# Patient Record
Sex: Male | Born: 1980
Health system: Southern US, Community
[De-identification: ages and names within clinical notes are randomized; demographics above are authoritative.]

---

## 2016-09-05 ENCOUNTER — Ambulatory Visit (INDEPENDENT_AMBULATORY_CARE_PROVIDER_SITE_OTHER): Payer: BLUE CROSS/BLUE SHIELD | Admitting: Family Medicine

## 2016-09-05 VITALS — BP 132/70 | HR 69 | Temp 97.9°F | Resp 16 | Ht 70.75 in | Wt 201.6 lb

## 2016-09-05 DIAGNOSIS — Z0000 Encounter for general adult medical examination without abnormal findings: Secondary | ICD-10-CM

## 2016-09-05 DIAGNOSIS — N5082 Scrotal pain: Secondary | ICD-10-CM

## 2016-09-05 LAB — POCT URINALYSIS DIP (MANUAL ENTRY)
Bilirubin, UA: NEGATIVE
Blood, UA: NEGATIVE
Glucose, UA: NEGATIVE
Ketones, POC UA: NEGATIVE
LEUKOCYTES UA: NEGATIVE
NITRITE UA: NEGATIVE
PH UA: 7
PROTEIN UA: NEGATIVE
Spec Grav, UA: 1.015
Urobilinogen, UA: 0.2

## 2016-09-05 NOTE — Progress Notes (Signed)
Chief Complaint  Patient presents with  . Annual Exam  . Testicle Pain    x 4 - 5 days    Subjective:  Travis Wiggins is a 35 y.o. male here for a health maintenance visit.  Patient is new pt  Testicular Pain Pt reports that Thursday night, 2 days ago, he woke up and had throbbing pain.  Reports that Thursday he played tennis then ran 4.5 miles at work.  He states that he did not have any pain at the time of the strenuous exercise.  He reports that he could narrow it down to the left testicle. He denies history of hernias. He denies any bulges in the groin.  He reports that his symptoms improved daily.  His pain is much better but noticed it when he was in a bouncy house and was jumping.   There are no active problems to display for this patient.   No past medical history on file.  No past surgical history on file.   No outpatient prescriptions prior to visit.   No facility-administered medications prior to visit.     Allergies  Allergen Reactions  . Penicillins      No family history on file.   Health Habits: Dental Exam: up to date Eye Exam: up to date Exercise: 5 times/week on average Current exercise activities: running, lifting and tennis Diet: balanced  Social History   Social History  . Marital status: Married    Spouse name: N/A  . Number of children: N/A  . Years of education: N/A   Occupational History  . Not on file.   Social History Main Topics  . Smoking status: Never Smoker  . Smokeless tobacco: Not on file  . Alcohol use Not on file  . Drug use: Unknown  . Sexual activity: Not on file   Other Topics Concern  . Not on file   Social History Narrative  . No narrative on file   History  Alcohol use Not on file   History  Smoking Status  . Never Smoker  Smokeless Tobacco  . Not on file   History  Drug use: Unknown      Health Maintenance: See under health Maintenance activity for review of completion dates as well.  There  is no immunization history on file for this patient.    Depression Screen-PHQ2/9 Depression screen [facility] 2/9 09/05/2016  Decreased Interest 0  Down, Depressed, Hopeless 0  PHQ - 2 Score 0      Depression Severity and Treatment Recommendations:  0-4= None  5-9= Mild / Treatment: Support, educate to call if worse; return in one month  10-14= Moderate / Treatment: Support, watchful waiting; Antidepressant or Psycotherapy  15-19= Moderately severe / Treatment: Antidepressant OR Psychotherapy  >= 20 = Major depression, severe / Antidepressant AND Psychotherapy    Review of Systems   Review of Systems  Constitutional: Negative for chills, fever and weight loss.  HENT: Negative for hearing loss and tinnitus.   Eyes: Negative for blurred vision, double vision and photophobia.  Respiratory: Negative for cough, shortness of breath and wheezing.   Cardiovascular: Negative for chest pain, palpitations and orthopnea.  Gastrointestinal: Negative for abdominal pain, blood in stool, constipation, nausea and vomiting.  Genitourinary: Negative for dysuria, flank pain, frequency, hematuria and urgency.  Musculoskeletal: Negative for myalgias and neck pain.  Skin: Negative for itching and rash.  Neurological: Negative for dizziness, tingling and headaches.  Endo/Heme/Allergies: Negative for environmental allergies and polydipsia. Does not  bruise/bleed easily.  Psychiatric/Behavioral: Negative for depression. The patient is not nervous/anxious.     See HPI for ROS as well.    Objective:   Vitals:   09/05/16 1405  BP: 132/70  Pulse: 69  Resp: 16  Temp: 97.9 F (36.6 C)  TempSrc: Oral  SpO2: 99%  Weight: 201 lb 9.6 oz (91.4 kg)  Height: 5' 10.75" (1.797 m)    Body mass index is 28.32 kg/m.  Physical Exam  Constitutional: He is oriented to person, place, and time. He appears well-developed and well-nourished.  HENT:  Head: Normocephalic and atraumatic.  Right Ear: External ear  normal.  Left Ear: External ear normal.  Nose: Nose normal.  Mouth/Throat: Oropharynx is clear and moist.  Eyes: Conjunctivae and EOM are normal. Pupils are equal, round, and reactive to light. Right eye exhibits no discharge. Left eye exhibits no discharge.  Neck: Normal range of motion. Neck supple.  Cardiovascular: Normal rate, regular rhythm and normal heart sounds.   No murmur heard. Pulmonary/Chest: Effort normal and breath sounds normal. No respiratory distress. He has no wheezes. He has no rales.  Abdominal: Soft. Bowel sounds are normal. He exhibits no distension. There is no tenderness. There is no rebound. Hernia confirmed negative in the right inguinal area and confirmed negative in the left inguinal area.  Genitourinary: Penis normal. Cremasteric reflex is present. Right testis shows no mass, no swelling and no tenderness. Right testis is descended. Cremasteric reflex is not absent on the right side. Left testis shows no mass, no swelling and no tenderness. Left testis is descended. Cremasteric reflex is not absent on the left side. Circumcised. No penile erythema. No discharge found.  Musculoskeletal: Normal range of motion. He exhibits no edema.  Lymphadenopathy:       Right: No inguinal adenopathy present.       Left: No inguinal adenopathy present.  Neurological: He is alert and oriented to person, place, and time. He has normal reflexes.  Skin: Skin is warm. No erythema.  Psychiatric: He has a normal mood and affect. His behavior is normal. Judgment and thought content normal.       Assessment/Plan:   Patient was seen for a health maintenance exam.  Counseled the patient on health maintenance issues. Reviewed her health mainteance schedule and ordered appropriate tests (see orders.) Counseled on regular exercise and weight management. Recommend regular eye exams and dental cleaning.   The following issues were addressed today for health maintenance:   Travis Wiggins was seen  today for annual exam and testicle pain.  Diagnoses and all orders for this visit:  Health maintenance examination- reviewed age appropriate screenings Since labs were done for biometrics will not repeat today  Acute pain in scrotum- advised pt to follow up for scrotal US Will check UA for infection -     US Scrotum; Future -     POCT urinalysis dipstick    No Follow-up on file.    Body mass index is 28.32 kg/m.:  Discussed the patient's BMI with patient. The BMI body mass index is 28.32 kg/m.     No future appointments.  Patient Instructions       IF you received an x-ray today, you will receive an invoice from [facility]. Please contact [facility] at [phone] with questions or concerns regarding your invoice.   IF you received labwork today, you will receive an invoice from [facility]. Please contact [facility] at [phone] with questions or concerns regarding your invoice.   Our  billing staff will not be able to assist you with questions regarding bills from these companies.  You will be contacted with the lab results as soon as they are available. The fastest way to get your results is to activate your My Chart account. Instructions are located on the last page of this paperwork. If you have not heard from [place] regarding the results in 2 weeks, please contact this office.    Health Maintenance, Male A healthy lifestyle and preventative care can promote health and wellness.  Maintain regular health, dental, and eye exams.  Eat a healthy diet. Foods like vegetables, fruits, whole grains, low-fat dairy products, and lean protein foods contain the nutrients you need and are low in calories. Decrease your intake of foods high in solid fats, added sugars, and salt. Get information about a proper diet from your health care provider, if necessary.  Regular physical exercise is one of the most important things you can do for your health. Most adults  should get at least 150 minutes of moderate-intensity exercise (any activity that increases your heart rate and causes you to sweat) each week. In addition, most adults need muscle-strengthening exercises on 2 or more days a week.   Maintain a healthy weight. The body mass index (BMI) is a screening tool to identify possible weight problems. It provides an estimate of body fat based on height and weight. Your health care provider can find your BMI and can help you achieve or maintain a healthy weight. For males 20 years and older:  A BMI below 18.5 is considered underweight.  A BMI of 18.5 to 24.9 is normal.  A BMI of 25 to 29.9 is considered overweight.  A BMI of 30 and above is considered obese.  Maintain normal blood lipids and cholesterol by exercising and minimizing your intake of saturated fat. Eat a balanced diet with plenty of fruits and vegetables. Blood tests for lipids and cholesterol should begin at age 44 and be repeated every 5 years. If your lipid or cholesterol levels are high, you are over age 83, or you are at high risk for heart disease, you may need your cholesterol levels checked more frequently.Ongoing high lipid and cholesterol levels should be treated with medicines if diet and exercise are not working.  If you smoke, find out from your health care provider how to quit. If you do not use tobacco, do not start.  Lung cancer screening is recommended for adults aged 55-80 years who are at high risk for developing lung cancer because of a history of smoking. A yearly low-dose CT scan of the lungs is recommended for people who have at least a 30-pack-year history of smoking and are current smokers or have quit within the past 15 years. A pack year of smoking is smoking an average of 1 pack of cigarettes a day for 1 year (for example, a 30-pack-year history of smoking could mean smoking 1 pack a day for 30 years or 2 packs a day for 15 years). Yearly screening should continue  until the smoker has stopped smoking for at least 15 years. Yearly screening should be stopped for people who develop a health problem that would prevent them from having lung cancer treatment.  If you choose to drink alcohol, do not have more than 2 drinks per day. One drink is considered to be 12 oz (360 mL) of beer, 5 oz (150 mL) of wine, or 1.5 oz (45 mL) of liquor.  Avoid the use  of street drugs. Do not share needles with anyone. Ask for help if you need support or instructions about stopping the use of drugs.  High blood pressure causes heart disease and increases the risk of stroke. High blood pressure is more likely to develop in:  People who have blood pressure in the end of the normal range (100-139/85-89 mm Hg).  People who are overweight or obese.  People who are African American.  If you are 5318-35 years of age, have your blood pressure checked every 3-5 years. If you are 35 years of age or older, have your blood pressure checked every year. You should have your blood pressure measured twice-once when you are at a hospital or clinic, and once when you are not at a hospital or clinic. Record the average of the two measurements. To check your blood pressure when you are not at a hospital or clinic, you can use:  An automated blood pressure machine at a pharmacy.  A home blood pressure monitor.  If you are 1545-170 years old, ask your health care provider if you should take aspirin to prevent heart disease.  Diabetes screening involves taking a blood sample to check your fasting blood sugar level. This should be done once every 3 years after age 35 if you are at a normal weight and without risk factors for diabetes. Testing should be considered at a younger age or be carried out more frequently if you are overweight and have at least 1 risk factor for diabetes.  Colorectal cancer can be detected and often prevented. Most routine colorectal cancer screening begins at the age of 35 and  continues through age 35. However, your health care provider may recommend screening at an earlier age if you have risk factors for colon cancer. On a yearly basis, your health care provider may provide home test kits to check for hidden blood in the stool. A small camera at the end of a tube may be used to directly examine the colon (sigmoidoscopy or colonoscopy) to detect the earliest forms of colorectal cancer. Talk to your health care provider about this at age 35 when routine screening begins. A direct exam of the colon should be repeated every 5-10 years through age 35, unless early forms of precancerous polyps or small growths are found.  People who are at an increased risk for hepatitis B should be screened for this virus. You are considered at high risk for hepatitis B if:  You were born in a country where hepatitis B occurs often. Talk with your health care provider about which countries are considered high risk.  Your parents were born in a high-risk country and you have not received a shot to protect against hepatitis B (hepatitis B vaccine).  You have HIV or AIDS.  You use needles to inject street drugs.  You live with, or have sex with, someone who has hepatitis B.  You are a man who has sex with other men (MSM).  You get hemodialysis treatment.  You take certain medicines for conditions like cancer, organ transplantation, and autoimmune conditions.  Hepatitis C blood testing is recommended for all people born from [id number] through 1965 and any individual with known risk factors for hepatitis C.  Healthy men should no longer receive prostate-specific antigen (PSA) blood tests as part of routine cancer screening. Talk to your health care provider about prostate cancer screening.  Testicular cancer screening is not recommended for adolescents or adult males who have no symptoms. Screening  includes self-exam, a health care provider exam, and other screening tests. Consult with your  health care provider about any symptoms you have or any concerns you have about testicular cancer.  Practice safe sex. Use condoms and avoid high-risk sexual practices to reduce the spread of sexually transmitted infections (STIs).  You should be screened for STIs, including gonorrhea and chlamydia if:  You are sexually active and are younger than 24 years.  You are older than 24 years, and your health care provider tells you that you are at risk for this type of infection.  Your sexual activity has changed since you were last screened, and you are at an increased risk for chlamydia or gonorrhea. Ask your health care provider if you are at risk.  If you are at risk of being infected with HIV, it is recommended that you take a prescription medicine daily to prevent HIV infection. This is called pre-exposure prophylaxis (PrEP). You are considered at risk if:  You are a man who has sex with other men (MSM).  You are a heterosexual man who is sexually active with multiple partners.  You take drugs by injection.  You are sexually active with a partner who has HIV.  Talk with your health care provider about whether you are at high risk of being infected with HIV. If you choose to begin PrEP, you should first be tested for HIV. You should then be tested every 3 months for as long as you are taking PrEP.  Use sunscreen. Apply sunscreen liberally and repeatedly throughout the day. You should seek shade when your shadow is shorter than you. Protect yourself by wearing long sleeves, pants, a wide-brimmed hat, and sunglasses year round whenever you are outdoors.  Tell your health care provider of new moles or changes in moles, especially if there is a change in shape or color. Also, tell your health care provider if a mole is larger than the size of a pencil eraser.  A one-time screening for abdominal aortic aneurysm (AAA) and surgical repair of large AAAs by ultrasound is recommended for men aged  65-75 years who are current or former smokers.  Stay current with your vaccines (immunizations). This information is not intended to replace advice given to you by your health care provider. Make sure you discuss any questions you have with your health care provider. Document Released: 03/05/2008 Document Revised: 09/28/2014 Document Reviewed: 06/11/2015 Elsevier Interactive Patient Education  2017 [employer]  Scrotal Swelling Scrotal swelling may occur on one or both sides of the scrotum. Pain may also occur with swelling. Possible causes of scrotal swelling include:  Injury.  Infection.  An ingrown hair or abrasion in the area.  Repeated rubbing from tight-fitting underwear.  Poor hygiene.  A weakened area in the muscles around the groin (hernia). A hernia can allow abdominal contents to push into the scrotum.  Fluid around the testicle (hydrocele).  Enlarged vein around the testicle (varicocele).  Certain medical treatments or existing conditions.  A recent genital surgery or procedure.  The spermatic cord becomes twisted in the scrotum, which cuts off blood supply (testicular torsion).  Testicular cancer. Follow these instructions at home: Once the cause of your scrotal swelling has been determined, you may be asked to monitor your scrotum for any changes. The following actions may help to alleviate any discomfort you are experiencing:  Rest and limit activity until the swelling goes away. Lying down is the preferred position.  Put ice on the scrotum:  Put ice in a plastic bag.  Place a towel between your skin and the bag.  Leave the ice on for 20 minutes, 2-3 times a day for 1-2 days.  Place a rolled towel under the testicles for support.  Wear loose-fitting clothing or an athletic support cup for comfort.  Take all medicines as directed by your health care provider.  Perform a monthly self-exam of the scrotum and penis. Feel for changes. Ask your health  care provider how to perform a monthly self-exam if you are unsure. Contact a health care provider if:  You have a sudden (acute) onset of pain that is persistent and not improving.  You notice a heavy feeling or fluid in the scrotum.  You have pain or burning while urinating.  You have blood in the urine or semen.  You feel a lump around the testicle.  You notice that one testicle is larger than the other (slight variation is normal).  You have a persistent dull ache or pain in the groin or scrotum. Get help right away if:  The pain does not go away or becomes severe.  You have a fever or shaking chills.  You have pain or vomiting that cannot be controlled.  You notice significant redness or swelling of one or both sides of the scrotum.  You experience redness spreading upward from your scrotum to your abdomen or downward from your scrotum to your thighs. This information is not intended to replace advice given to you by your health care provider. Make sure you discuss any questions you have with your health care provider. Document Released: 10/10/2010 Document Revised: 03/27/2016 Document Reviewed: 02/09/2013 Elsevier Interactive Patient Education  2017 [employer].

## 2016-09-05 NOTE — Patient Instructions (Addendum)
IF you received an x-ray today, you will receive an invoice from [facility]. Please contact [facility] at [phone] with questions or concerns regarding your invoice.   IF you received labwork today, you will receive an invoice from [facility]. Please contact [facility] at [phone] with questions or concerns regarding your invoice.   Our billing staff will not be able to assist you with questions regarding bills from these companies.  You will be contacted with the lab results as soon as they are available. The fastest way to get your results is to activate your My Chart account. Instructions are located on the last page of this paperwork. If you have not heard from [place] regarding the results in 2 weeks, please contact this office.    Health Maintenance, Male A healthy lifestyle and preventative care can promote health and wellness.  Maintain regular health, dental, and eye exams.  Eat a healthy diet. Foods like vegetables, fruits, whole grains, low-fat dairy products, and lean protein foods contain the nutrients you need and are low in calories. Decrease your intake of foods high in solid fats, added sugars, and salt. Get information about a proper diet from your health care provider, if necessary.  Regular physical exercise is one of the most important things you can do for your health. Most adults should get at least 150 minutes of moderate-intensity exercise (any activity that increases your heart rate and causes you to sweat) each week. In addition, most adults need muscle-strengthening exercises on 2 or more days a week.   Maintain a healthy weight. The body mass index (BMI) is a screening tool to identify possible weight problems. It provides an estimate of body fat based on height and weight. Your health care provider can find your BMI and can help you achieve or maintain a healthy weight. For males 20 years and older:  A BMI below 18.5 is considered  underweight.  A BMI of 18.5 to 24.9 is normal.  A BMI of 25 to 29.9 is considered overweight.  A BMI of 30 and above is considered obese.  Maintain normal blood lipids and cholesterol by exercising and minimizing your intake of saturated fat. Eat a balanced diet with plenty of fruits and vegetables. Blood tests for lipids and cholesterol should begin at age 90 and be repeated every 5 years. If your lipid or cholesterol levels are high, you are over age 51, or you are at high risk for heart disease, you may need your cholesterol levels checked more frequently.Ongoing high lipid and cholesterol levels should be treated with medicines if diet and exercise are not working.  If you smoke, find out from your health care provider how to quit. If you do not use tobacco, do not start.  Lung cancer screening is recommended for adults aged 57-80 years who are at high risk for developing lung cancer because of a history of smoking. A yearly low-dose CT scan of the lungs is recommended for people who have at least a 30-pack-year history of smoking and are current smokers or have quit within the past 15 years. A pack year of smoking is smoking an average of 1 pack of cigarettes a day for 1 year (for example, a 30-pack-year history of smoking could mean smoking 1 pack a day for 30 years or 2 packs a day for 15 years). Yearly screening should continue until the smoker has stopped smoking for at least 15 years. Yearly screening should be stopped for people who  develop a health problem that would prevent them from having lung cancer treatment.  If you choose to drink alcohol, do not have more than 2 drinks per day. One drink is considered to be 12 oz (360 mL) of beer, 5 oz (150 mL) of wine, or 1.5 oz (45 mL) of liquor.  Avoid the use of street drugs. Do not share needles with anyone. Ask for help if you need support or instructions about stopping the use of drugs.  High blood pressure causes heart disease and  increases the risk of stroke. High blood pressure is more likely to develop in:  People who have blood pressure in the end of the normal range (100-139/85-89 mm Hg).  People who are overweight or obese.  People who are African American.  If you are 29-91 years of age, have your blood pressure checked every 3-5 years. If you are 22 years of age or older, have your blood pressure checked every year. You should have your blood pressure measured twice-once when you are at a hospital or clinic, and once when you are not at a hospital or clinic. Record the average of the two measurements. To check your blood pressure when you are not at a hospital or clinic, you can use:  An automated blood pressure machine at a pharmacy.  A home blood pressure monitor.  If you are 57-59 years old, ask your health care provider if you should take aspirin to prevent heart disease.  Diabetes screening involves taking a blood sample to check your fasting blood sugar level. This should be done once every 3 years after age 70 if you are at a normal weight and without risk factors for diabetes. Testing should be considered at a younger age or be carried out more frequently if you are overweight and have at least 1 risk factor for diabetes.  Colorectal cancer can be detected and often prevented. Most routine colorectal cancer screening begins at the age of 89 and continues through age 38. However, your health care provider may recommend screening at an earlier age if you have risk factors for colon cancer. On a yearly basis, your health care provider may provide home test kits to check for hidden blood in the stool. A small camera at the end of a tube may be used to directly examine the colon (sigmoidoscopy or colonoscopy) to detect the earliest forms of colorectal cancer. Talk to your health care provider about this at age 41 when routine screening begins. A direct exam of the colon should be repeated every 5-10 years through  age 21, unless early forms of precancerous polyps or small growths are found.  People who are at an increased risk for hepatitis B should be screened for this virus. You are considered at high risk for hepatitis B if:  You were born in a country where hepatitis B occurs often. Talk with your health care provider about which countries are considered high risk.  Your parents were born in a high-risk country and you have not received a shot to protect against hepatitis B (hepatitis B vaccine).  You have HIV or AIDS.  You use needles to inject street drugs.  You live with, or have sex with, someone who has hepatitis B.  You are a man who has sex with other men (MSM).  You get hemodialysis treatment.  You take certain medicines for conditions like cancer, organ transplantation, and autoimmune conditions.  Hepatitis C blood testing is recommended for all people born  from 1945 through 1965 and any individual with known risk factors for hepatitis C.  Healthy men should no longer receive prostate-specific antigen (PSA) blood tests as part of routine cancer screening. Talk to your health care provider about prostate cancer screening.  Testicular cancer screening is not recommended for adolescents or adult males who have no symptoms. Screening includes self-exam, a health care provider exam, and other screening tests. Consult with your health care provider about any symptoms you have or any concerns you have about testicular cancer.  Practice safe sex. Use condoms and avoid high-risk sexual practices to reduce the spread of sexually transmitted infections (STIs).  You should be screened for STIs, including gonorrhea and chlamydia if:  You are sexually active and are younger than 24 years.  You are older than 24 years, and your health care provider tells you that you are at risk for this type of infection.  Your sexual activity has changed since you were last screened, and you are at an  increased risk for chlamydia or gonorrhea. Ask your health care provider if you are at risk.  If you are at risk of being infected with HIV, it is recommended that you take a prescription medicine daily to prevent HIV infection. This is called pre-exposure prophylaxis (PrEP). You are considered at risk if:  You are a man who has sex with other men (MSM).  You are a heterosexual man who is sexually active with multiple partners.  You take drugs by injection.  You are sexually active with a partner who has HIV.  Talk with your health care provider about whether you are at high risk of being infected with HIV. If you choose to begin PrEP, you should first be tested for HIV. You should then be tested every 3 months for as long as you are taking PrEP.  Use sunscreen. Apply sunscreen liberally and repeatedly throughout the day. You should seek shade when your shadow is shorter than you. Protect yourself by wearing long sleeves, pants, a wide-brimmed hat, and sunglasses year round whenever you are outdoors.  Tell your health care provider of new moles or changes in moles, especially if there is a change in shape or color. Also, tell your health care provider if a mole is larger than the size of a pencil eraser.  A one-time screening for abdominal aortic aneurysm (AAA) and surgical repair of large AAAs by ultrasound is recommended for men aged 65-75 years who are current or former smokers.  Stay current with your vaccines (immunizations). This information is not intended to replace advice given to you by your health care provider. Make sure you discuss any questions you have with your health care provider. Document Released: 03/05/2008 Document Revised: 09/28/2014 Document Reviewed: 06/11/2015 Elsevier Interactive Patient Education  2017 Elsevier Inc.  Scrotal Swelling Scrotal swelling may occur on one or both sides of the scrotum. Pain may also occur with swelling. Possible causes of scrotal  swelling include:  Injury.  Infection.  An ingrown hair or abrasion in the area.  Repeated rubbing from tight-fitting underwear.  Poor hygiene.  A weakened area in the muscles around the groin (hernia). A hernia can allow abdominal contents to push into the scrotum.  Fluid around the testicle (hydrocele).  Enlarged vein around the testicle (varicocele).  Certain medical treatments or existing conditions.  A recent genital surgery or procedure.  The spermatic cord becomes twisted in the scrotum, which cuts off blood supply (testicular torsion).  Testicular cancer. Follow these  instructions at home: Once the cause of your scrotal swelling has been determined, you may be asked to monitor your scrotum for any changes. The following actions may help to alleviate any discomfort you are experiencing:  Rest and limit activity until the swelling goes away. Lying down is the preferred position.  Put ice on the scrotum:  Put ice in a plastic bag.  Place a towel between your skin and the bag.  Leave the ice on for 20 minutes, 2-3 times a day for 1-2 days.  Place a rolled towel under the testicles for support.  Wear loose-fitting clothing or an athletic support cup for comfort.  Take all medicines as directed by your health care provider.  Perform a monthly self-exam of the scrotum and penis. Feel for changes. Ask your health care provider how to perform a monthly self-exam if you are unsure. Contact a health care provider if:  You have a sudden (acute) onset of pain that is persistent and not improving.  You notice a heavy feeling or fluid in the scrotum.  You have pain or burning while urinating.  You have blood in the urine or semen.  You feel a lump around the testicle.  You notice that one testicle is larger than the other (slight variation is normal).  You have a persistent dull ache or pain in the groin or scrotum. Get help right away if:  The pain does not go  away or becomes severe.  You have a fever or shaking chills.  You have pain or vomiting that cannot be controlled.  You notice significant redness or swelling of one or both sides of the scrotum.  You experience redness spreading upward from your scrotum to your abdomen or downward from your scrotum to your thighs. This information is not intended to replace advice given to you by your health care provider. Make sure you discuss any questions you have with your health care provider. Document Released: 10/10/2010 Document Revised: 03/27/2016 Document Reviewed: 02/09/2013 Elsevier Interactive Patient Education  2017 [employer]

## 2016-09-07 ENCOUNTER — Telehealth: Payer: Self-pay

## 2016-09-07 ENCOUNTER — Other Ambulatory Visit: Payer: Self-pay | Admitting: *Deleted

## 2016-09-07 DIAGNOSIS — R1032 Left lower quadrant pain: Secondary | ICD-10-CM

## 2016-09-07 NOTE — Telephone Encounter (Signed)
[facility] needs a US ART/VEN FLOW ABD PELV DOPPL added before they can schedule the ordered US Scrotum.   Thanks!

## 2016-09-15 ENCOUNTER — Ambulatory Visit
Admission: RE | Admit: 2016-09-15 | Discharge: 2016-09-15 | Disposition: A | Discharge status: Home / Self Care | Payer: BLUE CROSS/BLUE SHIELD | Source: Ambulatory Visit | Attending: Family Medicine | Admitting: Family Medicine

## 2016-09-15 DIAGNOSIS — R1032 Left lower quadrant pain: Secondary | ICD-10-CM

## 2016-09-15 DIAGNOSIS — N5082 Scrotal pain: Secondary | ICD-10-CM

## 2016-09-15 DIAGNOSIS — N503 Cyst of epididymis: Secondary | ICD-10-CM | POA: Diagnosis not present

## 2016-09-24 ENCOUNTER — Telehealth: Payer: Self-pay

## 2016-09-24 NOTE — Telephone Encounter (Signed)
Pt would like a CB concerning his lab results. CB#[phone]

## 2016-09-28 NOTE — Telephone Encounter (Signed)
lmtcb urine from dec normal, no other labs?

## 2016-10-01 ENCOUNTER — Telehealth: Payer: Self-pay | Admitting: *Deleted

## 2016-10-01 NOTE — Telephone Encounter (Signed)
Patient called to get his CT results, patient stated he has been waiting 2 weeks for the results. Patient states he is at work and can't answer his phone much, patient requested if he does not answer to leave a detailed message on his voicemail about his results. [phone]

## 2016-10-02 NOTE — Telephone Encounter (Signed)
Please advise 

## 2016-10-03 NOTE — Telephone Encounter (Signed)
Please leave a message with the following information for the patient on the voicemail. -------------------------- Notes Recorded by Rudell CobbBrian M Goldsmith on 09/22/2016 at 10:10 AM EST Did not reach patient left message to call back ------  Notes Recorded by Doristine BosworthZoe A Stallings, MD on 09/15/2016 at 9:32 PM EST Please let the patient know that his ultrasound of the testicle was pretty normal. He has a small cyst of the testicle which is not likely to be the cause of his pain. Let him know that if his symptoms persist or worsen I can send him to Urology.

## 2016-10-05 NOTE — Telephone Encounter (Signed)
Lm with details per dr Creta Levinstallings

## 2017-03-25 ENCOUNTER — Encounter: Payer: Self-pay | Admitting: Family Medicine

## 2017-03-25 ENCOUNTER — Ambulatory Visit (INDEPENDENT_AMBULATORY_CARE_PROVIDER_SITE_OTHER): Payer: BLUE CROSS/BLUE SHIELD | Admitting: Family Medicine

## 2017-03-25 VITALS — BP 110/70 | HR 85 | Temp 98.0°F | Resp 18 | Ht 70.0 in | Wt 198.2 lb

## 2017-03-25 DIAGNOSIS — H60331 Swimmer's ear, right ear: Secondary | ICD-10-CM

## 2017-03-25 MED ORDER — CIPROFLOXACIN-DEXAMETHASONE 0.3-0.1 % OT SUSP: 4.0000 [drp] | Freq: Two times a day (BID) | OTIC | 0 refills | Status: DC

## 2017-03-25 MED ORDER — AZITHROMYCIN 250 MG PO TABS: ORAL_TABLET | ORAL | 0 refills | Status: DC

## 2017-03-25 NOTE — Progress Notes (Signed)
  Chief Complaint  Patient presents with  . Ear Pain    X 1 day- right ear    HPI   Pt reports that he is having right ear pain for one day. The pain is worse in the past few hours He has a 653 months old and a 36 yo He reports that he started with some mild congestion He has not taken anything No fevers but reports chills Pt is a nonsmoker, does not have asthma or chronic sinusitis   History reviewed. No pertinent past medical history.  Current Outpatient Prescriptions  Medication Sig Dispense Refill  . azithromycin (ZITHROMAX) 250 MG tablet Take 2 tablet by mouth on day 1 and take one tablet by mouth daily 6 tablet 0  . ciprofloxacin-dexamethasone (CIPRODEX) OTIC suspension Place 4 drops into the right ear 2 (two) times daily. 7.5 mL 0   No current facility-administered medications for this visit.     Allergies:  Allergies  Allergen Reactions  . Penicillins     History reviewed. No pertinent surgical history.  Social History   Social History  . Marital status: Married    Spouse name: N/A  . Number of children: N/A  . Years of education: N/A   Social History Main Topics  . Smoking status: Never Smoker  . Smokeless tobacco: Never Used  . Alcohol use 2.4 oz/week    4 Cans of beer per week  . Drug use: No  . Sexual activity: Not Asked   Other Topics Concern  . None   Social History Narrative  . None    ROS Review of Systems See HPI Constitution: No fevers or chills No malaise No diaphoresis Skin: No rash or itching Eyes: no blurry vision, no double vision GU: no dysuria or hematuria Neuro: no dizziness or headaches  Objective: Vitals:   03/25/17 1654  BP: 110/70  Pulse: 85  Resp: 18  Temp: 98 F (36.7 C)  TempSrc: Oral  SpO2: 98%  Weight: 198 lb 3.2 oz (89.9 kg)  Height: 5\' 10"  (1.778 m)    Physical Exam General: alert, oriented, in NAD Head: normocephalic, atraumatic, no sinus tenderness Eyes: EOM intact, no scleral icterus or  conjunctival injection Ears: TM clear on left, right TM clear but there is beefy red erythema of the ear canal externally, tender with palpation and traction on the pinna Nose: mucosa nonerythematous, nonedematous Throat: no pharyngeal exudate or erythema Lymph: no posterior auricular, submental or cervical lymph adenopathy Heart: normal rate, normal sinus rhythm, no murmurs Lungs: clear to auscultation bilaterally, no wheezing   Assessment and Plan Travis Wiggins was seen today for ear pain.  Diagnoses and all orders for this visit:  Acute swimmer's ear of right side Will treat empirically No culture taken Give patient children changes and physical exam will treat with oral and topical  Other orders -     azithromycin (ZITHROMAX) 250 MG tablet; Take 2 tablet by mouth on day 1 and take one tablet by mouth daily -     ciprofloxacin-dexamethasone (CIPRODEX) OTIC suspension; Place 4 drops into the right ear 2 (two) times daily.     Travis Wiggins A Ayham Word

## 2017-03-25 NOTE — Patient Instructions (Addendum)
   IF you received an x-ray today, you will receive an invoice from [facility]. Please contact [facility] at [phone] with questions or concerns regarding your invoice.   IF you received labwork today, you will receive an invoice from [facility]. Please contact [facility] at [phone] with questions or concerns regarding your invoice.   Our billing staff will not be able to assist you with questions regarding bills from these companies.  You will be contacted with the lab results as soon as they are available. The fastest way to get your results is to activate your My Chart account. Instructions are located on the last page of this paperwork. If you have not heard from us regarding the results in 2 weeks, please contact this office.     Otitis Externa Otitis externa is an infection of the outer ear canal. The outer ear canal is the area between the outside of the ear and the eardrum. Otitis externa is sometimes called "swimmer's ear." What are the causes? This condition may be caused by:  Swimming in dirty water.  Moisture in the ear.  An injury to the inside of the ear.  An object stuck in the ear.  A cut or scrape on the outside of the ear.  What increases the risk? This condition is more likely to develop in swimmers. What are the signs or symptoms? The first symptom of this condition is often itching in the ear. Later signs and symptoms include:  Swelling of the ear.  Redness in the ear.  Ear pain. The pain may get worse when you pull on your ear.  Pus coming from the ear.  How is this diagnosed? This condition may be diagnosed by examining the ear and testing fluid from the ear for bacteria and funguses. How is this treated? This condition may be treated with:  Antibiotic ear drops. These are often given for 10-14 days.  Medicine to reduce itching and swelling.  Follow these instructions at home:  If you were prescribed antibiotic ear  drops, apply them as told by your health care provider. Do not stop using the antibiotic even if your condition improves.  Take over-the-counter and prescription medicines only as told by your health care provider.  Keep all follow-up visits as told by your health care provider. This is important. How is this prevented?  Keep your ear dry. Use the corner of a towel to dry your ear after you swim or bathe.  Avoid scratching or putting things in your ear. Doing these things can damage the ear canal or remove the protective wax that lines it, which makes it easier for bacteria and funguses to grow.  Avoid swimming in lakes, polluted water, or pools that may not have the right amount of chlorine.  Consider making ear drops and putting 3 or 4 drops in each ear after you swim. Ask your health care provider about how you can make ear drops. Contact a health care provider if:  You have a fever.  After 3 days your ear is still red, swollen, painful, or draining pus.  Your redness, swelling, or pain gets worse.  You have a severe headache.  You have redness, swelling, pain, or tenderness in the area behind your ear. This information is not intended to replace advice given to you by your health care provider. Make sure you discuss any questions you have with your health care provider. Document Released: 09/07/2005 Document Revised: 10/15/2015 Document Reviewed: 06/17/2015 Elsevier Interactive Patient   Education  2018 Elsevier Inc.  

## 2017-11-02 ENCOUNTER — Encounter: Payer: Self-pay | Admitting: Family Medicine

## 2017-11-02 ENCOUNTER — Other Ambulatory Visit: Payer: Self-pay

## 2017-11-02 ENCOUNTER — Ambulatory Visit: Payer: BLUE CROSS/BLUE SHIELD | Admitting: Family Medicine

## 2017-11-02 VITALS — BP 122/62 | HR 92 | Temp 98.6°F | Ht 71.0 in | Wt 196.6 lb

## 2017-11-02 DIAGNOSIS — R6889 Other general symptoms and signs: Secondary | ICD-10-CM

## 2017-11-02 NOTE — Patient Instructions (Addendum)
   IF you received an x-ray today, you will receive an invoice from [facility]. Please contact [facility] at [phone] with questions or concerns regarding your invoice.   IF you received labwork today, you will receive an invoice from [facility]. Please contact [facility] at [phone] with questions or concerns regarding your invoice.   Our billing staff will not be able to assist you with questions regarding bills from these companies.  You will be contacted with the lab results as soon as they are available. The fastest way to get your results is to activate your My Chart account. Instructions are located on the last page of this paperwork. If you have not heard from us regarding the results in 2 weeks, please contact this office.      Influenza, Adult Influenza, more commonly known as "the flu," is a viral infection that primarily affects the respiratory tract. The respiratory tract includes organs that help you breathe, such as the lungs, nose, and throat. The flu causes many common cold symptoms, as well as a high fever and body aches. The flu spreads easily from person to person (is contagious). Getting a flu shot (influenza vaccination) every year is the best way to prevent influenza. What are the causes? Influenza is caused by a virus. You can catch the virus by:  Breathing in droplets from an infected person's cough or sneeze.  Touching something that was recently contaminated with the virus and then touching your mouth, nose, or eyes.  What increases the risk? The following factors may make you more likely to get the flu:  Not cleaning your hands frequently with soap and water or alcohol-based hand sanitizer.  Having close contact with many people during cold and flu season.  Touching your mouth, eyes, or nose without washing or sanitizing your hands first.  Not drinking enough fluids or not eating a healthy diet.  Not getting enough sleep or  exercise.  Being under a high amount of stress.  Not getting a yearly (annual) flu shot.  You may be at a higher risk of complications from the flu, such as a severe lung infection (pneumonia), if you:  Are over the age of 65.  Are pregnant.  Have a weakened disease-fighting system (immune system). You may have a weakened immune system if you: ? Have HIV or AIDS. ? Are undergoing chemotherapy. ? Aretaking medicines that reduce the activity of (suppress) the immune system.  Have a long-term (chronic) illness, such as heart disease, kidney disease, diabetes, or lung disease.  Have a liver disorder.  Are obese.  Have anemia.  What are the signs or symptoms? Symptoms of this condition typically last 4-10 days and may include:  Fever.  Chills.  Headache, body aches, or muscle aches.  Sore throat.  Cough.  Runny or congested nose.  Chest discomfort and cough.  Poor appetite.  Weakness or tiredness (fatigue).  Dizziness.  Nausea or vomiting.  How is this diagnosed? This condition may be diagnosed based on your medical history and a physical exam. Your health care provider may do a nose or throat swab test to confirm the diagnosis. How is this treated? If influenza is detected early, you can be treated with antiviral medicine that can reduce the length of your illness and the severity of your symptoms. This medicine may be given by mouth (orally) or through an IV tube that is inserted in one of your veins. The goal of treatment is to relieve symptoms by taking   care of yourself at home. This may include taking over-the-counter medicines, drinking plenty of fluids, and adding humidity to the air in your home. In some cases, influenza goes away on its own. Severe influenza or complications from influenza may be treated in a hospital. Follow these instructions at home:  Take over-the-counter and prescription medicines only as told by your health care provider.  Use a  cool mist humidifier to add humidity to the air in your home. This can make breathing easier.  Rest as needed.  Drink enough fluid to keep your urine clear or pale yellow.  Cover your mouth and nose when you cough or sneeze.  Wash your hands with soap and water often, especially after you cough or sneeze. If soap and water are not available, use hand sanitizer.  Stay home from work or school as told by your health care provider. Unless you are visiting your health care provider, try to avoid leaving home until your fever has been gone for 24 hours without the use of medicine.  Keep all follow-up visits as told by your health care provider. This is important. How is this prevented?  Getting an annual flu shot is the best way to avoid getting the flu. You may get the flu shot in late summer, fall, or winter. Ask your health care provider when you should get your flu shot.  Wash your hands often or use hand sanitizer often.  Avoid contact with people who are sick during cold and flu season.  Eat a healthy diet, drink plenty of fluids, get enough sleep, and exercise regularly. Contact a health care provider if:  You develop new symptoms.  You have: ? Chest pain. ? Diarrhea. ? A fever.  Your cough gets worse.  You produce more mucus.  You feel nauseous or you vomit. Get help right away if:  You develop shortness of breath or difficulty breathing.  Your skin or nails turn a bluish color.  You have severe pain or stiffness in your neck.  You develop a sudden headache or sudden pain in your face or ear.  You cannot stop vomiting. This information is not intended to replace advice given to you by your health care provider. Make sure you discuss any questions you have with your health care provider. Document Released: 09/04/2000 Document Revised: 02/13/2016 Document Reviewed: 07/02/2015 Elsevier Interactive Patient Education  2017 Elsevier Inc.  

## 2017-11-02 NOTE — Progress Notes (Signed)
   2/12/201911:32 AM  Clement SayresJohn Rittenhouse [birth year], 37 y.o. male [id number]  Chief Complaint  Patient presents with  . Cough    congestion, some chills and nausea only at night    HPI:   Patient is a 37 y.o. male who presents today for 2 days nasal congestion, cough, headache. No fevers but severe chills. No SOB, ear pain, sore throat. Has had nausea but no vomiting, abd pain, diarrhea. Had flu vaccine 37yo daughter also sick, she had Tmax 100.5 last night  [facility] 11/02/2017 03/25/2017 09/05/2016  Decreased Interest 0 0 0  Down, Depressed, Hopeless 0 0 0  PHQ - 2 Score 0 0 0    Allergies  Allergen Reactions  . Penicillins     Prior to Admission medications   Not on File    History reviewed. No pertinent past medical history.  History reviewed. No pertinent surgical history.  Social History   Tobacco Use  . Smoking status: Never Smoker  . Smokeless tobacco: Never Used  Substance Use Topics  . Alcohol use: Yes    Alcohol/week: 2.4 oz    Types: 4 Cans of beer per week    Family History  Problem Relation Age of Onset  . Hypertension Father     ROS Per hpi  OBJECTIVE:  Blood pressure 122/62, pulse 92, temperature 98.6 F (37 C), temperature source Oral, height 5\' 11"  (1.803 m), weight 196 lb 9.6 oz (89.2 kg), SpO2 99 %.  Physical Exam  Constitutional: He is oriented to person, place, and time and well-developed, well-nourished, and in no distress.  HENT:  Head: Normocephalic and atraumatic.  Right Ear: Hearing, tympanic membrane, external ear and ear canal normal.  Left Ear: Hearing, tympanic membrane, external ear and ear canal normal.  Mouth/Throat: Oropharynx is clear and moist. No oropharyngeal exudate.  Eyes: Conjunctivae and EOM are normal. Pupils are equal, round, and reactive to light.  Neck: Neck supple.  Cardiovascular: Normal rate and regular rhythm. Exam reveals no gallop and no friction rub.  No murmur heard. Pulmonary/Chest:  Effort normal and breath sounds normal. He has no wheezes. He has no rales.  Lymphadenopathy:    He has no cervical adenopathy.  Neurological: He is alert and oriented to person, place, and time. Gait normal.  Skin: Skin is warm and dry.     ASSESSMENT and PLAN  1. Flu-like symptoms Discussed supportive measures for: increase hydration, rest, OTC medications, etc. RTC precautions discussed.  Return if symptoms worsen or fail to improve.    Myles LippsIrma M Santiago, MD Primary Care at [facility] [address] [place], [place] [address] Ph.  [phone] Fax [phone]

## 2017-11-10 ENCOUNTER — Encounter: Payer: Self-pay | Admitting: Family Medicine

## 2019-04-13 DIAGNOSIS — Z20828 Contact with and (suspected) exposure to other viral communicable diseases: Secondary | ICD-10-CM | POA: Diagnosis not present

## 2019-07-27 DIAGNOSIS — Z20828 Contact with and (suspected) exposure to other viral communicable diseases: Secondary | ICD-10-CM | POA: Diagnosis not present

## 2019-07-28 DIAGNOSIS — Z20828 Contact with and (suspected) exposure to other viral communicable diseases: Secondary | ICD-10-CM | POA: Diagnosis not present

## 2019-08-05 ENCOUNTER — Other Ambulatory Visit: Payer: Self-pay | Admitting: *Deleted

## 2019-08-05 DIAGNOSIS — Z20828 Contact with and (suspected) exposure to other viral communicable diseases: Secondary | ICD-10-CM

## 2019-08-05 DIAGNOSIS — Z20822 Contact with and (suspected) exposure to covid-19: Secondary | ICD-10-CM

## 2019-08-08 LAB — NOVEL CORONAVIRUS, NAA: SARS-CoV-2, NAA: NOT DETECTED

## 2019-12-23 DIAGNOSIS — J301 Allergic rhinitis due to pollen: Secondary | ICD-10-CM | POA: Diagnosis not present

## 2019-12-23 DIAGNOSIS — J019 Acute sinusitis, unspecified: Secondary | ICD-10-CM | POA: Diagnosis not present

## 2020-01-02 ENCOUNTER — Encounter: Payer: Self-pay | Admitting: Registered Nurse

## 2020-01-02 ENCOUNTER — Other Ambulatory Visit: Payer: Self-pay

## 2020-01-02 ENCOUNTER — Telehealth (INDEPENDENT_AMBULATORY_CARE_PROVIDER_SITE_OTHER): Payer: BC Managed Care – PPO | Admitting: Registered Nurse

## 2020-01-02 DIAGNOSIS — J3489 Other specified disorders of nose and nasal sinuses: Secondary | ICD-10-CM

## 2020-01-02 DIAGNOSIS — J302 Other seasonal allergic rhinitis: Secondary | ICD-10-CM | POA: Diagnosis not present

## 2020-01-02 MED ORDER — MONTELUKAST SODIUM 10 MG PO TABS: 10.0000 mg | ORAL_TABLET | Freq: Every day | ORAL | 3 refills | Status: DC

## 2020-01-02 MED ORDER — CETIRIZINE HCL 10 MG PO TABS: 10.0000 mg | ORAL_TABLET | Freq: Every day | ORAL | 11 refills | Status: DC

## 2020-01-02 MED ORDER — PREDNISONE 20 MG PO TABS: 40.0000 mg | ORAL_TABLET | Freq: Every day | ORAL | 0 refills | Status: DC

## 2020-01-02 MED ORDER — FLUTICASONE PROPIONATE 50 MCG/ACT NA SUSP: 2.0000 | Freq: Every day | NASAL | 6 refills | Status: DC

## 2020-01-02 NOTE — Progress Notes (Signed)
Telemedicine Encounter- SOAP NOTE Established Patient  This telephone encounter was conducted with the patient's (or proxy's) verbal consent via audio telecommunications: yes  Patient was instructed to have this encounter in a suitably private space; and to only have persons present to whom they give permission to participate. In addition, patient identity was confirmed by use of name plus two identifiers (DOB and address).  I discussed the limitations, risks, security and privacy concerns of performing an evaluation and management service by telephone and the availability of in person appointments. I also discussed with the patient that there may be a patient responsible charge related to this service. The patient expressed understanding and agreed to proceed.  I spent a total of 12 minutes talking with the patient or their proxy.  Chief Complaint  Patient presents with  . Sinus Problem    just finish antibiotic for nasal congestion and sinus pressure for three last 3 weeks. Still having symptoms     Subjective   Travis Wiggins is a 39 y.o. established patient. Telephone visit today for ongoing sinus pressure  HPI Started around 3 weeks ago, was seen in urgent care around 1 week ago, given course of levaquin which he has since finished Still having ongoing sinus pressure, congestion, blowing nose frequently History of mild seasonal allergies, never been like this No epistaxis, sore throat, globus sensation, headaches, visual changes, other sensory changes, fever, fatigue, cough, chills, nvd.  Patient Active Problem List   Diagnosis Date Noted  . Left groin pain 09/07/2016    No past medical history on file.  Current Outpatient Medications  Medication Sig Dispense Refill  . azelastine (ASTELIN) 0.1 % nasal spray Place 2 sprays into both nostrils 2 (two) times daily.    . cetirizine (ZYRTEC) 10 MG tablet Take 1 tablet (10 mg total) by mouth daily. 30 tablet 11  . fluticasone  (FLONASE) 50 MCG/ACT nasal spray Place 2 sprays into both nostrils daily. 16 g 6  . levofloxacin (LEVAQUIN) 500 MG tablet Take 500 mg by mouth daily.    . montelukast (SINGULAIR) 10 MG tablet Take 1 tablet (10 mg total) by mouth at bedtime. 30 tablet 3  . predniSONE (DELTASONE) 20 MG tablet Take 2 tablets (40 mg total) by mouth daily with breakfast. 8 tablet 0  . triamcinolone (KENALOG) 0.025 % cream triamcinolone acetonide 0.025 % topical cream     No current facility-administered medications for this visit.    Allergies  Allergen Reactions  . Penicillins     Social History   Socioeconomic History  . Marital status: Married    Spouse name: Not on file  . Number of children: Not on file  . Years of education: Not on file  . Highest education level: Not on file  Occupational History  . Not on file  Tobacco Use  . Smoking status: Never Smoker  . Smokeless tobacco: Never Used  Substance and Sexual Activity  . Alcohol use: Yes    Alcohol/week: 4.0 standard drinks    Types: 4 Cans of beer per week  . Drug use: No  . Sexual activity: Not on file  Other Topics Concern  . Not on file  Social History Narrative  . Not on file   Social Determinants of Health   Financial Resource Strain:   . Difficulty of Paying Living Expenses:   Food Insecurity:   . Worried About Programme researcher, broadcasting/film/video in the Last Year:   . Barista in  the Last Year:   Transportation Needs:   . Film/video editor (Medical):   Marland Kitchen Lack of Transportation (Non-Medical):   Physical Activity:   . Days of Exercise per Week:   . Minutes of Exercise per Session:   Stress:   . Feeling of Stress :   Social Connections:   . Frequency of Communication with Friends and Family:   . Frequency of Social Gatherings with Friends and Family:   . Attends Religious Services:   . Active Member of Clubs or Organizations:   . Attends Archivist Meetings:   Marland Kitchen Marital Status:   Intimate Partner Violence:   .  Fear of Current or Ex-Partner:   . Emotionally Abused:   Marland Kitchen Physically Abused:   . Sexually Abused:     ROS Per hpi   Objective   Vitals as reported by the patient: There were no vitals filed for this visit.  Travis Wiggins was seen today for sinus problem.  Diagnoses and all orders for this visit:  Seasonal allergies -     cetirizine (ZYRTEC) 10 MG tablet; Take 1 tablet (10 mg total) by mouth daily. -     fluticasone (FLONASE) 50 MCG/ACT nasal spray; Place 2 sprays into both nostrils daily. -     predniSONE (DELTASONE) 20 MG tablet; Take 2 tablets (40 mg total) by mouth daily with breakfast. -     montelukast (SINGULAIR) 10 MG tablet; Take 1 tablet (10 mg total) by mouth at bedtime.  Sinus pressure -     cetirizine (ZYRTEC) 10 MG tablet; Take 1 tablet (10 mg total) by mouth daily. -     fluticasone (FLONASE) 50 MCG/ACT nasal spray; Place 2 sprays into both nostrils daily. -     predniSONE (DELTASONE) 20 MG tablet; Take 2 tablets (40 mg total) by mouth daily with breakfast. -     montelukast (SINGULAIR) 10 MG tablet; Take 1 tablet (10 mg total) by mouth at bedtime.   PLAN  Suspect allergic component given severe allergy season  Cetirizine, flonase, montelukast, and prednisone burst  Pt will contact our office by Friday to discuss improvement or ongoing symptoms  Patient encouraged to call clinic with any questions, comments, or concerns.  I discussed the assessment and treatment plan with the patient. The patient was provided an opportunity to ask questions and all were answered. The patient agreed with the plan and demonstrated an understanding of the instructions.   The patient was advised to call back or seek an in-person evaluation if the symptoms worsen or if the condition fails to improve as anticipated.  I provided 12 minutes of non-face-to-face time during this encounter.  Maximiano Coss, NP  Primary Care at [facility]

## 2020-01-02 NOTE — Patient Instructions (Signed)
° ° ° °  If you have lab work done today you will be contacted with your lab results within the next 2 weeks.  If you have not heard from us then please contact us. The fastest way to get your results is to register for My Chart. ° ° °IF you received an x-ray today, you will receive an invoice from [facility]. Please contact [facility] at [phone] with questions or concerns regarding your invoice.  ° °IF you received labwork today, you will receive an invoice from [facility]. Please contact [facility] at [phone] with questions or concerns regarding your invoice.  ° °Our billing staff will not be able to assist you with questions regarding bills from these companies. ° °You will be contacted with the lab results as soon as they are available. The fastest way to get your results is to activate your My Chart account. Instructions are located on the last page of this paperwork. If you have not heard from us regarding the results in 2 weeks, please contact this office. °  ° ° ° °

## 2020-01-05 ENCOUNTER — Other Ambulatory Visit: Payer: Self-pay | Admitting: Registered Nurse

## 2020-01-05 DIAGNOSIS — B9689 Other specified bacterial agents as the cause of diseases classified elsewhere: Secondary | ICD-10-CM

## 2020-01-05 MED ORDER — AZITHROMYCIN 250 MG PO TABS: ORAL_TABLET | ORAL | 0 refills | Status: DC

## 2020-01-08 NOTE — Telephone Encounter (Signed)
Attempted to call the patient and try to set up a virtual visit today. LVM

## 2020-01-09 ENCOUNTER — Telehealth (INDEPENDENT_AMBULATORY_CARE_PROVIDER_SITE_OTHER): Payer: BC Managed Care – PPO | Admitting: Registered Nurse

## 2020-01-09 ENCOUNTER — Other Ambulatory Visit: Payer: Self-pay

## 2020-01-09 ENCOUNTER — Encounter: Payer: Self-pay | Admitting: Registered Nurse

## 2020-01-09 DIAGNOSIS — J302 Other seasonal allergic rhinitis: Secondary | ICD-10-CM | POA: Diagnosis not present

## 2020-01-09 DIAGNOSIS — B9689 Other specified bacterial agents as the cause of diseases classified elsewhere: Secondary | ICD-10-CM

## 2020-01-09 DIAGNOSIS — J019 Acute sinusitis, unspecified: Secondary | ICD-10-CM

## 2020-01-09 NOTE — Progress Notes (Signed)
Telemedicine Encounter- SOAP NOTE Established Patient  This telephone encounter was conducted with the patient's (or proxy's) verbal consent via audio telecommunications: yes  Patient was instructed to have this encounter in a suitably private space; and to only have persons present to whom they give permission to participate. In addition, patient identity was confirmed by use of name plus two identifiers (DOB and address).  I discussed the limitations, risks, security and privacy concerns of performing an evaluation and management service by telephone and the availability of in person appointments. I also discussed with the patient that there may be a patient responsible charge related to this service. The patient expressed understanding and agreed to proceed.  I spent a total of 11 minutes talking with the patient or their proxy.  Chief Complaint  Patient presents with  . Follow-up    follow up for sinus issues still having all of the same symptoms of runny nose, sinus pressure with headache. taking the zpack and allergy medications he was given but still no relief    Subjective   Travis Wiggins is a 39 y.o. established patient. Telephone visit today for sinus pressure follow up   HPI He has been seen by urgent care and myself for this issue. Over the past 4-5 weeks, he has tried: Levaquin, azithromycin, montelukast, flonase, prednisone burst, azelastine nasal, and cetirizine Without any relief  Still having sinus pressure and pain with rhinorrhea, frequent nose blowing, and some itching in nose Denies lower respiratory symptoms, shob, chest pain, visual changes, hearing changes, ear pressure or pain, changes to taste or smell, nvd, neuro symptoms, peripheral weakness numbness or tingling.  Patient Active Problem List   Diagnosis Date Noted  . Left groin pain 09/07/2016    No past medical history on file.  Current Outpatient Medications  Medication Sig Dispense Refill  .  azelastine (ASTELIN) 0.1 % nasal spray Place 2 sprays into both nostrils 2 (two) times daily.    Marland Kitchen azithromycin (ZITHROMAX) 250 MG tablet Take 2 tabs by mouth on first day. Then take 1 tablet by mouth daily. Finish entire supply. 6 tablet 0  . cetirizine (ZYRTEC) 10 MG tablet Take 1 tablet (10 mg total) by mouth daily. 30 tablet 11  . fluticasone (FLONASE) 50 MCG/ACT nasal spray Place 2 sprays into both nostrils daily. 16 g 6  . levofloxacin (LEVAQUIN) 500 MG tablet Take 500 mg by mouth daily.    . montelukast (SINGULAIR) 10 MG tablet Take 1 tablet (10 mg total) by mouth at bedtime. 30 tablet 3  . predniSONE (DELTASONE) 20 MG tablet Take 2 tablets (40 mg total) by mouth daily with breakfast. 8 tablet 0  . triamcinolone (KENALOG) 0.025 % cream triamcinolone acetonide 0.025 % topical cream     No current facility-administered medications for this visit.    Allergies  Allergen Reactions  . Penicillins     Social History   Socioeconomic History  . Marital status: Married    Spouse name: Not on file  . Number of children: Not on file  . Years of education: Not on file  . Highest education level: Not on file  Occupational History  . Not on file  Tobacco Use  . Smoking status: Never Smoker  . Smokeless tobacco: Never Used  Substance and Sexual Activity  . Alcohol use: Yes    Alcohol/week: 4.0 standard drinks    Types: 4 Cans of beer per week  . Drug use: No  . Sexual activity: Not on  file  Other Topics Concern  . Not on file  Social History Narrative  . Not on file   Social Determinants of Health   Financial Resource Strain:   . Difficulty of Paying Living Expenses:   Food Insecurity:   . Worried About Charity fundraiser in the Last Year:   . Arboriculturist in the Last Year:   Transportation Needs:   . Film/video editor (Medical):   Marland Kitchen Lack of Transportation (Non-Medical):   Physical Activity:   . Days of Exercise per Week:   . Minutes of Exercise per Session:    Stress:   . Feeling of Stress :   Social Connections:   . Frequency of Communication with Friends and Family:   . Frequency of Social Gatherings with Friends and Family:   . Attends Religious Services:   . Active Member of Clubs or Organizations:   . Attends Archivist Meetings:   Marland Kitchen Marital Status:   Intimate Partner Violence:   . Fear of Current or Ex-Partner:   . Emotionally Abused:   Marland Kitchen Physically Abused:   . Sexually Abused:     Review of Systems  Constitutional: Negative.   HENT: Positive for congestion and sinus pain. Negative for ear discharge, ear pain, hearing loss, nosebleeds, sore throat and tinnitus.   Eyes: Negative.   Respiratory: Negative.  Negative for stridor.   Cardiovascular: Negative.   Gastrointestinal: Negative.   Genitourinary: Negative.   Musculoskeletal: Negative.   Skin: Negative.   Neurological: Negative.   Endo/Heme/Allergies: Negative.   Psychiatric/Behavioral: Negative.   All other systems reviewed and are negative.   Objective   Vitals as reported by the patient: There were no vitals filed for this visit.  Tannen was seen today for follow-up.  Diagnoses and all orders for this visit:  Acute bacterial sinusitis -     Ambulatory referral to ENT  Seasonal allergies -     Ambulatory referral to ENT   PLAN  Given the number of options we've already explored, will now refer to ENT for work up and management  Pt to continue supportive tx as he has finished abx  Patient encouraged to call clinic with any questions, comments, or concerns.   I discussed the assessment and treatment plan with the patient. The patient was provided an opportunity to ask questions and all were answered. The patient agreed with the plan and demonstrated an understanding of the instructions.   The patient was advised to call back or seek an in-person evaluation if the symptoms worsen or if the condition fails to improve as anticipated.  I provided 11  minutes of non-face-to-face time during this encounter.  Maximiano Coss, NP  Primary Care at [facility]

## 2020-01-09 NOTE — Patient Instructions (Signed)
° ° ° °  If you have lab work done today you will be contacted with your lab results within the next 2 weeks.  If you have not heard from us then please contact us. The fastest way to get your results is to register for My Chart. ° ° °IF you received an x-ray today, you will receive an invoice from [facility]. Please contact [facility] at [phone] with questions or concerns regarding your invoice.  ° °IF you received labwork today, you will receive an invoice from [facility]. Please contact [facility] at [phone] with questions or concerns regarding your invoice.  ° °Our billing staff will not be able to assist you with questions regarding bills from these companies. ° °You will be contacted with the lab results as soon as they are available. The fastest way to get your results is to activate your My Chart account. Instructions are located on the last page of this paperwork. If you have not heard from us regarding the results in 2 weeks, please contact this office. °  ° ° ° °

## 2020-01-18 ENCOUNTER — Telehealth: Payer: Self-pay | Admitting: Registered Nurse

## 2020-01-18 NOTE — Telephone Encounter (Signed)
Patient is wondering where we are at on ENT referral . Patient is still so sick and and hasn't heard anything please reach out to patinet

## 2020-02-01 ENCOUNTER — Encounter (INDEPENDENT_AMBULATORY_CARE_PROVIDER_SITE_OTHER): Payer: Self-pay | Admitting: Otolaryngology

## 2020-02-01 ENCOUNTER — Ambulatory Visit (INDEPENDENT_AMBULATORY_CARE_PROVIDER_SITE_OTHER): Payer: BC Managed Care – PPO | Admitting: Otolaryngology

## 2020-02-01 ENCOUNTER — Other Ambulatory Visit: Payer: Self-pay

## 2020-02-01 VITALS — Temp 98.2°F

## 2020-02-01 DIAGNOSIS — J310 Chronic rhinitis: Secondary | ICD-10-CM

## 2020-02-01 NOTE — Progress Notes (Signed)
HPI: Travis Wiggins is a 39 y.o. male who presents is referred by his PCP for evaluation of nasal sinus complaints.  Over the past 2 months he has had chronic sinus issues although he has done much better over the past couple weeks..  Previously he had a lot of drainage from his nose as well as pressure and headaches.  He had been treated with 2 rounds of antibiotics including Levaquin and azithromycin.  He also been treated with a short course of prednisone.  He is taken Flonase as well as Singulair and antihistamine.  He is referred here by his PCP.  He had earlier seen a physician at urgent care. However over the past few weeks he has felt much better.  No past medical history on file. No past surgical history on file. Social History   Socioeconomic History  . Marital status: Married    Spouse name: Not on file  . Number of children: Not on file  . Years of education: Not on file  . Highest education level: Not on file  Occupational History  . Not on file  Tobacco Use  . Smoking status: Never Smoker  . Smokeless tobacco: Never Used  Substance and Sexual Activity  . Alcohol use: Yes    Alcohol/week: 4.0 standard drinks    Types: 4 Cans of beer per week  . Drug use: No  . Sexual activity: Not on file  Other Topics Concern  . Not on file  Social History Narrative  . Not on file   Social Determinants of Health   Financial Resource Strain:   . Difficulty of Paying Living Expenses:   Food Insecurity:   . Worried About Charity fundraiser in the Last Year:   . Arboriculturist in the Last Year:   Transportation Needs:   . Film/video editor (Medical):   Marland Kitchen Lack of Transportation (Non-Medical):   Physical Activity:   . Days of Exercise per Week:   . Minutes of Exercise per Session:   Stress:   . Feeling of Stress :   Social Connections:   . Frequency of Communication with Friends and Family:   . Frequency of Social Gatherings with Friends and Family:   . Attends Religious  Services:   . Active Member of Clubs or Organizations:   . Attends Archivist Meetings:   Marland Kitchen Marital Status:    Family History  Problem Relation Age of Onset  . Hypertension Father    Allergies  Allergen Reactions  . Penicillins    Prior to Admission medications   Medication Sig Start Date End Date Taking? Authorizing Provider  azelastine (ASTELIN) 0.1 % nasal spray Place 2 sprays into both nostrils 2 (two) times daily. 12/23/19  Yes [provider]  azithromycin (ZITHROMAX) 250 MG tablet Take 2 tabs by mouth on first day. Then take 1 tablet by mouth daily. Finish entire supply. 01/05/20  Yes Maximiano Coss, NP  cetirizine (ZYRTEC) 10 MG tablet Take 1 tablet (10 mg total) by mouth daily. 01/02/20  Yes Maximiano Coss, NP  fluticasone (FLONASE) 50 MCG/ACT nasal spray Place 2 sprays into both nostrils daily. 01/02/20  Yes Maximiano Coss, NP  levofloxacin (LEVAQUIN) 500 MG tablet Take 500 mg by mouth daily. 12/23/19  Yes [provider]  montelukast (SINGULAIR) 10 MG tablet Take 1 tablet (10 mg total) by mouth at bedtime. 01/02/20  Yes Maximiano Coss, NP  predniSONE (DELTASONE) 20 MG tablet Take 2 tablets (40 mg total)  by mouth daily with breakfast. 01/02/20  Yes Janeece Agee, NP  triamcinolone (KENALOG) 0.025 % cream triamcinolone acetonide 0.025 % topical cream   Yes [provider]     Positive ROS: Otherwise negative  All other systems have been reviewed and were otherwise negative with the exception of those mentioned in the HPI and as above.  Physical Exam: Constitutional: Alert, well-appearing, no acute distress Ears: External ears without lesions or tenderness. Ear canals are clear bilaterally.  TMs are clear bilaterally with no middle ear fluid noted. Nasal: External nose without lesions. Septum slightly deviated to the left with posterior left septal spur.  Mild rhinitis with clear mucus discharge.  Both middle meatus regions are clear with no  evidence of mucopurulent discharge.  No polyps noted..  Denies having any yellow-green discharge from his nose recently. Oral: Lips and gums without lesions. Tongue and palate mucosa without lesions. Posterior oropharynx clear. Neck: No palpable adenopathy or masses Respiratory: Breathing comfortably  Skin: No facial/neck lesions or rash noted.  Procedures  Assessment: History of sinus infection presently doing much better. Chronic rhinitis  Plan: Reviewed with patient concerning treatment of sinus infections.  Regular use of nasal steroid spray will help reduce swelling in the nose as well as reduce swelling within the sinus region. If he gets symptoms of sinus pressure would initially take decongestant therapy and use saline irrigation. If he has persistent yellow-green discharge for over for 5 days with then take antibiotics.  No evidence of active infection presently requiring further antibiotic therapy. For the nasal drainage suggested use of saline rinse and/or antihistamine. He will follow-up for recheck next time he gets a bad sinus infection.   Narda Bonds, MD   CC:

## 2020-03-20 DIAGNOSIS — M25561 Pain in right knee: Secondary | ICD-10-CM | POA: Insufficient documentation

## 2020-03-28 ENCOUNTER — Encounter: Payer: Self-pay | Admitting: Family Medicine

## 2020-03-28 ENCOUNTER — Ambulatory Visit: Payer: Self-pay

## 2020-03-28 ENCOUNTER — Other Ambulatory Visit: Payer: Self-pay

## 2020-03-28 ENCOUNTER — Ambulatory Visit: Payer: BC Managed Care – PPO | Admitting: Family Medicine

## 2020-03-28 VITALS — BP 110/74 | HR 65 | Ht 71.0 in | Wt 198.4 lb

## 2020-03-28 DIAGNOSIS — M25561 Pain in right knee: Secondary | ICD-10-CM | POA: Diagnosis not present

## 2020-03-28 DIAGNOSIS — M25521 Pain in right elbow: Secondary | ICD-10-CM

## 2020-03-28 DIAGNOSIS — G8929 Other chronic pain: Secondary | ICD-10-CM | POA: Diagnosis not present

## 2020-03-28 NOTE — Progress Notes (Signed)
Subjective:    CC: R knee and R elbow pain  I, Molly Weber, LAT, ATC, am serving as scribe for Dr. [name].  HPI: Pt is a 39 y/o male presenting w/ c/o R knee and R elbow pain.  R knee:  Pain x 1.5-2 weeks w/ no known MOI.  States that he started doing more running and lower body exercise during the pandemic.  He had been working on increasing the speed and running hills before his pain started.  Pain is located at lateral posterior knee and hamstring insertion. -Swelling: yes -Mechanical symptoms: No -Aggravating factors: R knee flexion AROM; walking/running -Treatments tried: ice  R elbow:  Chronic.  He locates his pain to his R medial epicondyle w/ radiating pain into his R forearm -Swelling: No -Aggravating factors: upper body lifting; throwing -Treatments tried: avoiding aggravating activiities  Pertinent review of Systems: No fevers or chills  Relevant historical information: History of sinusitis.  Healthy man typically runs about 10+ miles per week.   Objective:    Vitals:   03/28/20 0901  BP: 110/74  Pulse: 65  SpO2: 99%   General: Well Developed, well nourished, and in no acute distress.   MSK:  Right elbow normal-appearing Normal motion. Normal strength. Tender palpation medial epicondyle and minimally tender palpation lateral epicondyle. Some pain with resisted wrist flexion and grip as well as wrist extension. Minimally positive Tinel's at cubital tunnel. Pulses cap refill and sensation intact distally.  Right hip: Normal-appearing nontender normal motion normal strength.  Right knee: Normal-appearing Normal motion. Not particularly tender to palpation. Stable ligamentous exam. Negative McMurray's test. Intact strength to extension.  Pain and limited strength 4/5 to flexion.  Pain located at hamstring insertion lateral posterior knee.   Lab and Radiology Results  Diagnostic Limited MSK Ultrasound of: Right elbow Medial epicondyle  visualized.  Area of tenderness with ultrasound probe is medial epicondyle.  Slight hyperechoic change at tendon insertion indicates early calcific tendinopathy. No joint effusion at medial elbow joint Impression: Medial epicondylitis  Diagnostic Limited MSK Ultrasound of: Right knee Quad tendon intact normal-appearing Small joint effusion superior patellar space. Patellar tendon normal. Medial joint line hypoechoic fissure through medial meniscus indicates likely medial meniscus tear. Lateral joint line again hypoechoic fissure through lateral meniscus indicates likely lateral meniscus tear. Posterior knee no Baker's cyst.  Small amount of fluid tracking superficial to biceps femoris tendon near insertion onto lateral knee.  No visible tendon tear present. Impression: Probable medial and lateral meniscus tears.  Small joint effusion.  Insertional lateral hamstring tendinitis   Impression and Recommendations:    Assessment and Plan: 39 y.o. male with  Right knee pain predominantly due to hamstring tendinitis laterally.  Plan to treat with Voltaren gel and eccentric exercises as taught in clinic today by ATC.  If not improved in 2 weeks patient will notify me and will refer to physical therapy.  So recommend compression sleeve discussed return to activity progression and precautions.  Right elbow pain: Mostly due to medial epicondylitis.  Patient has a small amount of lateral epicondylitis as well.  Doubtful for cubital tunnel syndrome as he does not have significant radiating pain or paresthesias distally.  Again treat with home exercise as taught in clinic today by ATC, and Voltaren gel.  Back up to physical therapy if not better in 2 weeks.  Activity progression also reviewed.  Also recommend counterforce brace or compressive elbow sleeve.  Recheck back as needed.   Orders Placed This  Encounter  Procedures  . [place] LIMITED JOINT SPACE STRUCTURES LOW RIGHT(NO LINKED CHARGES)    Order  Specific Question:   Reason for Exam (SYMPTOM  OR DIAGNOSIS REQUIRED)    Answer:   R knee pain    Order Specific Question:   Preferred imaging location?    Answer:   [facility]   No orders of the defined types were placed in this encounter.   Discussed warning signs or symptoms. Please see discharge instructions. Patient expresses understanding.   The above documentation has been reviewed and is accurate and complete [name], M.D.

## 2020-03-28 NOTE — Patient Instructions (Signed)
Please perform the exercise program that we have prepared for you and gone over in detail on a daily basis.  In addition to the handout you were provided you can access your program through: [email or web address]   Your unique program code is: [email or web address]  I recommend you obtained a compression sleeve to help with your joint problems. There are many options on the market however I recommend obtaining a elbow and knee Body Helix compression sleeve.  You can find information (including how to appropriate measure yourself for sizing) can be found at www.Body [email or web address].ch.  Many of these products are health savings account (HSA) eligible.   You can use the compression sleeve at any time throughout the day but is most important to use while being active as well as for 2 hours post-activity.   It is appropriate to ice following activity with the compression sleeve in place.  Please purchase Voltaren gel that is available over-the-counter.

## 2020-04-03 ENCOUNTER — Encounter: Payer: Self-pay | Admitting: Family Medicine

## 2020-04-03 DIAGNOSIS — M25561 Pain in right knee: Secondary | ICD-10-CM

## 2020-04-08 ENCOUNTER — Ambulatory Visit: Payer: BC Managed Care – PPO | Admitting: Physical Therapy

## 2020-04-08 ENCOUNTER — Other Ambulatory Visit: Payer: Self-pay

## 2020-04-08 ENCOUNTER — Encounter: Payer: Self-pay | Admitting: Physical Therapy

## 2020-04-08 DIAGNOSIS — M25561 Pain in right knee: Secondary | ICD-10-CM

## 2020-04-08 DIAGNOSIS — M25661 Stiffness of right knee, not elsewhere classified: Secondary | ICD-10-CM

## 2020-04-08 DIAGNOSIS — R262 Difficulty in walking, not elsewhere classified: Secondary | ICD-10-CM

## 2020-04-08 NOTE — Patient Instructions (Signed)
Access Code: [email or web address] URL: [email or web address] Date: 04/08/2020 Prepared by: Narda Amber  Exercises Hooklying Hamstring Stretch with Strap - 3 x daily - 7 x weekly - 5 reps - 30 seconds hold Prone Quadriceps Stretch with Strap - 3 x daily - 7 x weekly - 5 reps - 30 seconds hold Supine Piriformis Stretch with Foot on Ground - 3 x daily - 7 x weekly - 5 reps - 30 seconds hold Standing Gastroc Stretch - 3 x daily - 7 x weekly - 5 reps - 30 seconds hold

## 2020-04-08 NOTE — Therapy (Signed)
[facility] Therapy [address] [place], [place], [address] Phone: [phone]   Fax:  [phone]  Physical Therapy Evaluation  Patient Details  Name: Travis Wiggins MRN: [id number] Date of Birth: [birth year] Referring Provider (PT): [name], MD   Encounter Date: 04/08/2020   PT End of Session - 04/08/20 1211    Visit Number 1    Number of Visits 8    Date for PT Re-Evaluation 06/03/20    Authorization Type BCBS    PT Start Time 1015    PT Stop Time 1100    PT Time Calculation (min) 45 min    Activity Tolerance Patient tolerated treatment well    Behavior During Therapy [facility] for tasks assessed/performed           History reviewed. No pertinent past medical history.  History reviewed. No pertinent surgical history.  There were no vitals filed for this visit.    Subjective Assessment - 04/08/20 1020    Subjective Pt arriving today reporting R knee pain that has recently increased his running over the last few weeks. Pt training for a 7 mile race. Pt states he has been doing stretches that Dr. Denyse Amass gave him but feels he has not improved. Pt also reporting decreasd R knee flexion. Pt feels at times his knee is unstable and the neoprene knee sleeve is helping some.    Limitations Other (comment)    How long can you walk comfortably? 15 minutes    Patient Stated Goals Continue to run without pain    Currently in Pain? No/denies    Pain Score --   no pain at rest, 3/10 with walking   Pain Location Knee    Pain Orientation Left    Pain Descriptors / Indicators Tightness    Pain Type Acute pain    Pain Onset 1 to 4 weeks ago    Pain Frequency Intermittent    Aggravating Factors  walking, running    Pain Relieving Factors sitting, voltaren gel, ice, neoprene brace, ibuprofen    Effect of Pain on Daily Activities difficutly walking longer distances, unable to run              [facility] [place] [facility] PT Assessment - 04/08/20 0001      Assessment   Medical  Diagnosis R knee pain, M25.561    Referring Provider (PT) [name], MD    Onset Date/Surgical Date --   3 weeks ago   Hand Dominance Right    Prior Therapy yes, L broken leg, years ago      Precautions   Precautions None      Restrictions   Weight Bearing Restrictions No      Balance Screen   Has the patient fallen in the past 6 months No    Is the patient reluctant to leave their home because of a fear of falling?  No      Home Environment   Living Environment Private residence    Living Arrangements Spouse/significant other    Home Access Stairs to enter    Entrance Stairs-Number of Steps 15 steps    Entrance Stairs-Rails Left    Home Layout Multi-level      Cognition   Overall Cognitive Status Within Functional Limits for tasks assessed      Observation/Other Assessments-Edema    Edema Circumferential      Circumferential Edema   Circumferential - Right R: 40.5 centimeters, L 38 centimeters      Posture/Postural Control  Posture/Postural Control Postural limitations    Postural Limitations Rounded Shoulders;Forward head;Decreased lumbar lordosis      ROM / Strength   AROM / PROM / Strength AROM;PROM;Strength      AROM   AROM Assessment Site Knee    Right/Left Knee Right;Left    Right Knee Extension 0    Right Knee Flexion 105    Left Knee Extension 0    Left Knee Flexion 148      PROM   PROM Assessment Site Knee    Right/Left Knee Right;Left    Right Knee Extension 0    Right Knee Flexion 120    Left Knee Extension 0    Left Knee Flexion 150      Strength   Overall Strength Deficits    Strength Assessment Site Knee    Right/Left Knee Right;Left    Right Knee Flexion 4+/5    Right Knee Extension 4+/5    Left Knee Flexion 5/5    Left Knee Extension 5/5      Palpation   Patella mobility TTP: superior and inferior patella mobility    Palpation comment TTP on medial tibial plateau and posterior hamstring tendons. Also tender over R piriformis        Special Tests    Special Tests Knee Special Tests    Knee Special tests  Lateral Pull Sign;Patellofemoral Grind Test (Clarke's Sign)      Lateral Pull Sign    Findings Negative    Side Right      Patellofemoral Grind test (Clark's Sign)   Findings Negative    Side  Right      Transfers   Five time sit to stand comments  12.1 No UE support      Ambulation/Gait   Gait Pattern Step-through pattern;Decreased stance time - right;Decreased hip/knee flexion - right                      Objective measurements completed on examination: See above findings.               PT Education - 04/08/20 1211    Education Details PT POC, HEP    Person(s) Educated Patient    Methods Explanation;Demonstration;Handout    Comprehension Verbalized understanding;Returned demonstration               PT Long Term Goals - 04/08/20 1220      PT LONG TERM GOAL #1   Title Pt will be independent in his HEP and progression.    Time 8    Period Weeks    Status New    Target Date 06/03/20      PT LONG TERM GOAL #2   Title Pt will be able to jog 2 miles with pain </= 2/10.    Baseline unable to job due to increased pain    Time 8    Period Weeks    Status New      PT LONG TERM GOAL #3   Title Pt will be able to improve R knee fleixon to >/= 140 degrees with pain </= 1/10.    Baseline 125 passively on 7.19.2021    Time 8    Period Weeks    Status New                  Plan - 04/08/20 1105    Clinical Impression Statement Pt arriving to therapy reporting 3 week history of  R knee pain. Edema  and mild weakness noted when compared to left knee. Pt with tenderness noted in posterior hamstring tendons and limited flexibility. Pt also with edema noted in mid thigh compared to left. Pt presenting with decreased R knee flexion to 120 passively and 105 actively. (Left knee flexion = 150 degrees). Pt was edu in a HEP for stretching. Pt's goal is to return to running  without pain. Skilled PT needed to address pt's impairments with the below interventions. We discussed Iontophoresis for possible next visit.    Examination-Activity Limitations Squat;Stand;Stairs;Other    Examination-Participation Restrictions Other;Driving;Yard Work;Community Activity    Stability/Clinical Decision Making Stable/Uncomplicated    Clinical Decision Making Low    Rehab Potential Excellent    PT Frequency 1x / week    PT Duration 8 weeks    PT Treatment/Interventions Cryotherapy;Electrical Stimulation;Iontophoresis 4mg /ml Dexamethasone;Ultrasound;Gait training;Stair training;Functional mobility training;Neuromuscular re-education;Balance training;Therapeutic exercise;Therapeutic activities;Patient/family education;Manual techniques;Taping;Dry needling;Passive range of motion;Vasopneumatic Device    PT Next Visit Plan further assess gait, Hamstring, IT band, piriformis, hip flexor stretches, lumbar stretching and core strengtheing, SLS activities    PT Home Exercise Plan Access Code:    Consulted and Agree with Plan of Care Patient           Patient will benefit from skilled therapeutic intervention in order to improve the following deficits and impairments:  Pain, Decreased activity tolerance, Impaired flexibility, Decreased strength, Difficulty walking, Other (comment), Decreased range of motion  Visit Diagnosis: Acute pain of right knee  Stiffness of right knee, not elsewhere classified  Difficulty in walking, not elsewhere classified     Problem List Patient Active Problem List   Diagnosis Date Noted  . Left groin pain 09/07/2016    09/09/2016, PT, MPT 04/08/2020, 12:35 PM  [facility] Physical Therapy [address] [place], [place], [place] Phone: [phone]   Fax:  [phone]  Name: Konor Noren MRN: Clement Sayres Date of Birth: [birth year]

## 2020-04-11 ENCOUNTER — Telehealth: Payer: Self-pay | Admitting: Registered Nurse

## 2020-04-11 NOTE — Telephone Encounter (Signed)
Called pt LVM for him to call back regarding his upcoming physical/ Provider wants it virtual

## 2020-04-15 ENCOUNTER — Encounter: Payer: Self-pay | Admitting: Physical Therapy

## 2020-04-18 ENCOUNTER — Encounter: Payer: Self-pay | Admitting: Physical Therapy

## 2020-04-18 ENCOUNTER — Ambulatory Visit: Payer: BC Managed Care – PPO | Admitting: Physical Therapy

## 2020-04-18 ENCOUNTER — Other Ambulatory Visit: Payer: Self-pay

## 2020-04-18 DIAGNOSIS — R262 Difficulty in walking, not elsewhere classified: Secondary | ICD-10-CM

## 2020-04-18 DIAGNOSIS — M25661 Stiffness of right knee, not elsewhere classified: Secondary | ICD-10-CM | POA: Diagnosis not present

## 2020-04-18 DIAGNOSIS — M25561 Pain in right knee: Secondary | ICD-10-CM

## 2020-04-18 NOTE — Therapy (Signed)
[facility] Physical Therapy [address] [place], [place], [address] Phone: [phone]   Fax:  [phone]  Physical Therapy Treatment  Patient Details  Name: Travis Wiggins MRN: [id number] Date of Birth: 02/23/1981 Referring Provider (PT): [name], MD   Encounter Date: 04/18/2020   PT End of Session - 04/18/20 1105    Visit Number 2    Number of Visits 8    Date for PT Re-Evaluation 06/03/20    Authorization Type BCBS    PT Start Time 1016    PT Stop Time 1059    PT Time Calculation (min) 43 min    Activity Tolerance Patient tolerated treatment well    Behavior During Therapy [facility] for tasks assessed/performed           History reviewed. No pertinent past medical history.  History reviewed. No pertinent surgical history.  There were no vitals filed for this visit.   Subjective Assessment - 04/18/20 1017    Subjective knee is still swollen.    Limitations Other (comment)    How long can you walk comfortably? 15 minutes    Patient Stated Goals Continue to run without pain    Currently in Pain? No/denies    Pain Onset 1 to 4 weeks ago                             [facility] Adult PT Treatment/Exercise - 04/18/20 1018      Exercises   Exercises Knee/Hip      Knee/Hip Exercises: Stretches   Passive Hamstring Stretch Right;3 reps;30 seconds    Quad Stretch Right;3 reps;30 seconds      Knee/Hip Exercises: Aerobic   Recumbent Bike L3 x 6 min      Knee/Hip Exercises: Machines for Strengthening   Cybex Knee Extension RLE only 15# 3x10    Cybex Knee Flexion RLE only 3x10; 25#      Modalities   Modalities Iontophoresis      Iontophoresis   Type of Iontophoresis Dexamethasone    Location Rt lateral hamstring    Dose 1.0 cc    Time 6 hour patch      Manual Therapy   Manual Therapy Soft tissue mobilization    Soft tissue mobilization IASTM to posterior Rt knee                  PT Education - 04/18/20 1105     Education Details ionto    Person(s) Educated Patient    Methods Explanation;Handout    Comprehension Verbalized understanding               PT Long Term Goals - 04/18/20 1106      PT LONG TERM GOAL #1   Title Pt will be independent in his HEP and progression.    Time 8    Period Weeks    Status On-going    Target Date 06/03/20      PT LONG TERM GOAL #2   Title Pt will be able to jog 2 miles with pain </= 2/10.    Baseline unable to job due to increased pain    Time 8    Period Weeks    Status On-going    Target Date 06/03/20      PT LONG TERM GOAL #3   Title Pt will be able to improve R knee fleixon to >/= 140 degrees with pain </= 1/10.    Baseline 125  passively on 7.19.2021    Time 8    Period Weeks    Status On-going    Target Date 06/03/20                 Plan - 04/18/20 1106    Clinical Impression Statement Pt demonstrated ~ 8 deg improvement in knee flexion in prone following manual therapy today.  Overall still with swelling present in Rt knee affecting ROM and activity.  Will continue to benefit from PT to maximize function.    Examination-Activity Limitations Squat;Stand;Stairs;Other    Examination-Participation Restrictions Other;Driving;Yard Work;Community Activity    Stability/Clinical Decision Making Stable/Uncomplicated    Rehab Potential Excellent    PT Frequency 1x / week    PT Duration 8 weeks    PT Treatment/Interventions Cryotherapy;Electrical Stimulation;Iontophoresis 4mg /ml Dexamethasone;Ultrasound;Gait training;Stair training;Functional mobility training;Neuromuscular re-education;Balance training;Therapeutic exercise;Therapeutic activities;Patient/family education;Manual techniques;Taping;Dry needling;Passive range of motion;Vasopneumatic Device    PT Next Visit Plan RLE strengthening, core strengthening, SLS activiites, assess response to ionto    PT Home Exercise Plan Access Code:    Consulted and Agree with Plan of Care  Patient           Patient will benefit from skilled therapeutic intervention in order to improve the following deficits and impairments:  Pain, Decreased activity tolerance, Impaired flexibility, Decreased strength, Difficulty walking, Other (comment), Decreased range of motion  Visit Diagnosis: Acute pain of right knee  Stiffness of right knee, not elsewhere classified  Difficulty in walking, not elsewhere classified     Problem List Patient Active Problem List   Diagnosis Date Noted  . Left groin pain 09/07/2016      09/09/2016, PT, DPT 04/18/20 11:08 AM    [facility] Physical Therapy [address] [place], [place], [place] Phone: [phone]   Fax:  [phone]  Name: Travis Wiggins MRN: Clement Sayres Date of Birth: 04/14/1981

## 2020-04-18 NOTE — Patient Instructions (Signed)

## 2020-04-26 ENCOUNTER — Ambulatory Visit: Payer: BC Managed Care – PPO

## 2020-04-29 ENCOUNTER — Ambulatory Visit: Payer: BC Managed Care – PPO

## 2020-05-01 ENCOUNTER — Ambulatory Visit: Payer: BC Managed Care – PPO | Admitting: Physical Therapy

## 2020-05-01 ENCOUNTER — Encounter: Payer: Self-pay | Admitting: Physical Therapy

## 2020-05-01 ENCOUNTER — Other Ambulatory Visit: Payer: Self-pay

## 2020-05-01 DIAGNOSIS — R262 Difficulty in walking, not elsewhere classified: Secondary | ICD-10-CM

## 2020-05-01 DIAGNOSIS — M25561 Pain in right knee: Secondary | ICD-10-CM | POA: Diagnosis not present

## 2020-05-01 DIAGNOSIS — M25661 Stiffness of right knee, not elsewhere classified: Secondary | ICD-10-CM | POA: Diagnosis not present

## 2020-05-01 NOTE — Therapy (Signed)
[facility] Physical Therapy [address] [place], [place], [address] Phone: [phone]   Fax:  [phone]  Physical Therapy Treatment  Patient Details  Name: Travis Wiggins MRN: [id number] Date of Birth: 1981/01/01 Referring Provider (PT): [name], MD   Encounter Date: 05/01/2020   PT End of Session - 05/01/20 1558    Visit Number 3    Number of Visits 8    Date for PT Re-Evaluation 06/03/20    Authorization Type BCBS    PT Start Time 1534   pt arrived late   PT Stop Time 1558    PT Time Calculation (min) 24 min    Activity Tolerance Patient tolerated treatment well    Behavior During Therapy [facility] for tasks assessed/performed           History reviewed. No pertinent past medical history.  History reviewed. No pertinent surgical history.  There were no vitals filed for this visit.   Subjective Assessment - 05/01/20 1537    Subjective feels better overall, still having some swelling but has noticed improvement over the past couple of days    Limitations Other (comment)    How long can you walk comfortably? 15 minutes    Patient Stated Goals Continue to run without pain    Pain Onset 1 to 4 weeks ago                             [facility] Adult PT Treatment/Exercise - 05/01/20 1537      Knee/Hip Exercises: Aerobic   Recumbent Bike L3 x 5 min      Knee/Hip Exercises: Machines for Strengthening   Cybex Knee Extension RLE only 15# 3x10    Cybex Knee Flexion RLE only 3x10; 25#    Cybex Leg Press RLE jumping 31# x10      Knee/Hip Exercises: Standing   Step Down Right;3 sets;10 reps;Step Height: 6"      Iontophoresis   Type of Iontophoresis Dexamethasone    Location Rt lateral hamstring    Dose 1.0 cc    Time 6 hour patch                       PT Long Term Goals - 04/18/20 1106      PT LONG TERM GOAL #1   Title Pt will be independent in his HEP and progression.    Time 8    Period Weeks    Status On-going      Target Date 06/03/20      PT LONG TERM GOAL #2   Title Pt will be able to jog 2 miles with pain </= 2/10.    Baseline unable to job due to increased pain    Time 8    Period Weeks    Status On-going    Target Date 06/03/20      PT LONG TERM GOAL #3   Title Pt will be able to improve R knee fleixon to >/= 140 degrees with pain </= 1/10.    Baseline 125 passively on 7.19.2021    Time 8    Period Weeks    Status On-going    Target Date 06/03/20                 Plan - 05/01/20 1558    Clinical Impression Statement Pt reporting improvement in pain and swelling as well as better ROM today.  Still has some pain  limitations especially with eccentric quad exercises.  Will continue to benefit from PT to maximize function.    Examination-Activity Limitations Squat;Stand;Stairs;Other    Examination-Participation Restrictions Other;Driving;Yard Work;Community Activity    Stability/Clinical Decision Making Stable/Uncomplicated    Rehab Potential Excellent    PT Frequency 1x / week    PT Duration 8 weeks    PT Treatment/Interventions Cryotherapy;Electrical Stimulation;Iontophoresis 4mg /ml Dexamethasone;Ultrasound;Gait training;Stair training;Functional mobility training;Neuromuscular re-education;Balance training;Therapeutic exercise;Therapeutic activities;Patient/family education;Manual techniques;Taping;Dry needling;Passive range of motion;Vasopneumatic Device    PT Next Visit Plan RLE strengthening, core strengthening, SLS activiites, assess response to ionto, eccentric quad control    PT Home Exercise Plan Access Code:    Consulted and Agree with Plan of Care Patient           Patient will benefit from skilled therapeutic intervention in order to improve the following deficits and impairments:  Pain, Decreased activity tolerance, Impaired flexibility, Decreased strength, Difficulty walking, Other (comment), Decreased range of motion  Visit Diagnosis: Acute pain of  right knee  Stiffness of right knee, not elsewhere classified  Difficulty in walking, not elsewhere classified     Problem List Patient Active Problem List   Diagnosis Date Noted  . Left groin pain 09/07/2016      09/09/2016, PT, DPT 05/01/20 3:59 PM     [facility] Therapy [address] [place], [place], [place] Phone: [phone]   Fax:  [phone]  Name: Travis Wiggins MRN: Travis Wiggins Date of Birth: 1981-07-25

## 2020-05-03 ENCOUNTER — Encounter: Payer: BC Managed Care – PPO | Admitting: Registered Nurse

## 2020-05-09 ENCOUNTER — Other Ambulatory Visit: Payer: Self-pay

## 2020-05-09 ENCOUNTER — Encounter: Payer: Self-pay | Admitting: Physical Therapy

## 2020-05-09 ENCOUNTER — Ambulatory Visit: Payer: BC Managed Care – PPO | Admitting: Physical Therapy

## 2020-05-09 DIAGNOSIS — M25561 Pain in right knee: Secondary | ICD-10-CM

## 2020-05-09 DIAGNOSIS — R262 Difficulty in walking, not elsewhere classified: Secondary | ICD-10-CM | POA: Diagnosis not present

## 2020-05-09 DIAGNOSIS — M25661 Stiffness of right knee, not elsewhere classified: Secondary | ICD-10-CM | POA: Diagnosis not present

## 2020-05-09 NOTE — Therapy (Signed)
[facility] [address] [place], [place], [address] Phone: [phone]   Fax:  [phone]  Physical Therapy Treatment  Patient Details  Name: Travis Wiggins MRN: [id number] Date of Birth: 11/27/1980 Referring Provider (PT): [name], MD   Encounter Date: 05/09/2020   PT End of Session - 05/09/20 0855    Visit Number 4    Number of Visits 8    Date for PT Re-Evaluation 06/03/20    Authorization Type BCBS    PT Start Time 0845    PT Stop Time 0925    PT Time Calculation (min) 40 min    Activity Tolerance Patient tolerated treatment well    Behavior During Therapy [facility] for tasks assessed/performed           History reviewed. No pertinent past medical history.  History reviewed. No pertinent surgical history.  There were no vitals filed for this visit.   Subjective Assessment - 05/09/20 0852    Subjective Pt reproting still mild swelling, but improvements overall and more ease with descending stairs.    Patient Stated Goals Continue to run without pain    Currently in Pain? Yes    Pain Location Knee    Pain Orientation Left    Pain Descriptors / Indicators Tightness    Pain Type Acute pain    Pain Onset More than a month ago                             [facility] Adult PT Treatment/Exercise - 05/09/20 0001      Knee/Hip Exercises: Stretches   Active Hamstring Stretch Right;2 reps;30 seconds    Gastroc Stretch Limitations slant board x 2 holding 30 seconds      Knee/Hip Exercises: Aerobic   Recumbent Bike L3 x 7 min      Knee/Hip Exercises: Machines for Strengthening   Cybex Knee Extension RLE only 15# 2 x 15 reps    Cybex Knee Flexion RLE only 2x15; 25#    Cybex Leg Press RLE jumping 31# x10      Knee/Hip Exercises: Standing   Step Down Right;15 reps;Hand Hold: 1;Step Height: 8";Limitations    Step Down Limitations pt reporting tighntess/ soreness with step downs    Other Standing Knee Exercises mini squats  to 90 degrees flexion with UE support x 15       Iontophoresis   Type of Iontophoresis Dexamethasone    Location R lateral quad    Dose 1.0 cc    Time 6 hour patch                       PT Long Term Goals - 05/09/20 0904      PT LONG TERM GOAL #1   Title Pt will be independent in his HEP and progression.    Status On-going      PT LONG TERM GOAL #2   Title Pt will be able to jog 2 miles with pain </= 2/10.    Status On-going      PT LONG TERM GOAL #3   Title Pt will be able to improve R knee fleixon to >/= 140 degrees with pain </= 1/10.    Status On-going                 Plan - 05/09/20 0900    Clinical Impression Statement Pt ariving to therapy reporting more tightness than pain in his  R knee. Pt reporting while on vacation at the beach he noticed difficulty with walking on the beach and while in the ocean.  Pt still with edema noted when compared to the left. Pt working on [employer]eron[employer]. Pt reprorting increased difficulty with step downs from 8 inch step height. Continue skilled PT to progress toward pt's PLOF.    Examination-Activity Limitations Squat;Stand;Stairs;Other    Examination-Participation Restrictions Other;Driving;Yard Work;Community Activity    Stability/Clinical Decision Making Stable/Uncomplicated    Rehab Potential Excellent    PT Frequency 1x / week    PT Duration 8 weeks    PT Treatment/Interventions Cryotherapy;Electrical Stimulation;Iontophoresis 4mg /ml Dexamethasone;Ultrasound;Gait training;Stair training;Functional mobility training;Neuromuscular re-education;Balance training;Therapeutic exercise;Therapeutic activities;Patient/family education;Manual techniques;Taping;Dry needling;Passive range of motion;Vasopneumatic Device    PT Next Visit Plan RLE strengthening, core strengthening, SLS activiites, assess response to ionto, eccentric quad control    PT Home Exercise Plan Access Code:    Consulted and Agree with  Plan of Care Patient           Patient will benefit from skilled therapeutic intervention in order to improve the following deficits and impairments:  Pain, Decreased activity tolerance, Impaired flexibility, Decreased strength, Difficulty walking, Other (comment), Decreased range of motion  Visit Diagnosis: Acute pain of right knee  Stiffness of right knee, not elsewhere classified  Difficulty in walking, not elsewhere classified     Problem List Patient Active Problem List   Diagnosis Date Noted  . Left groin pain 09/07/2016    09/09/2016, PT, MPT 05/09/2020, 9:33 AM  [facility] Physical Therapy [address] [place], [place], [place] Phone: [phone]   Fax:  [phone]  Name: Travis Wiggins MRN: Clement Sayres Date of Birth: March 13, 1981

## 2020-05-16 ENCOUNTER — Ambulatory Visit: Payer: BC Managed Care – PPO | Admitting: Physical Therapy

## 2020-05-16 ENCOUNTER — Other Ambulatory Visit: Payer: Self-pay

## 2020-05-16 ENCOUNTER — Encounter: Payer: Self-pay | Admitting: Physical Therapy

## 2020-05-16 DIAGNOSIS — R262 Difficulty in walking, not elsewhere classified: Secondary | ICD-10-CM | POA: Diagnosis not present

## 2020-05-16 DIAGNOSIS — M25661 Stiffness of right knee, not elsewhere classified: Secondary | ICD-10-CM | POA: Diagnosis not present

## 2020-05-16 DIAGNOSIS — M25561 Pain in right knee: Secondary | ICD-10-CM

## 2020-05-16 NOTE — Therapy (Signed)
[facility] [address] [place], [place], [address] Phone: [phone]   Fax:  [phone]  Physical Therapy Treatment  Patient Details  Name: Travis Wiggins MRN: [id number] Date of Birth: 10-25-1980 Referring Provider (PT): [name], MD   Encounter Date: 05/16/2020   PT End of Session - 05/16/20 0927    Visit Number 5    Number of Visits 8    Date for PT Re-Evaluation 06/03/20    Authorization Type BCBS    PT Start Time [phone]    PT Stop Time 0922    PT Time Calculation (min) 39 min    Activity Tolerance Patient tolerated treatment well    Behavior During Therapy [facility] for tasks assessed/performed           History reviewed. No pertinent past medical history.  History reviewed. No pertinent surgical history.  There were no vitals filed for this visit.   Subjective Assessment - 05/16/20 0843    Subjective pain is improved, not quite back to normal    Patient Stated Goals Continue to run without pain    Pain Score 0-No pain    Pain Onset More than a month ago                             [facility] Adult PT Treatment/Exercise - 05/16/20 0845      Knee/Hip Exercises: Aerobic   Recumbent Bike L5 x 7 min      Knee/Hip Exercises: Machines for Strengthening   Cybex Knee Extension RLE only 15# 2 x 15 reps; focus on slow eccentrics    Cybex Knee Flexion RLE only 2x15; 25#      Knee/Hip Exercises: Plyometrics   Bilateral Jumping 1 set;10 reps;Box Height: 6"    Other Plyometric Exercises light skipping/jogging without difficulty or significant deviations      Knee/Hip Exercises: Standing   Functional Squat 3 sets;10 reps    Functional Squat Limitations RLE only, TRX      Iontophoresis   Type of Iontophoresis Dexamethasone    Location R ant/lateral knee    Dose 1.0 cc    Time 6 hour patch                       PT Long Term Goals - 05/09/20 0904      PT LONG TERM GOAL #1   Title Pt will be independent  in his HEP and progression.    Status On-going      PT LONG TERM GOAL #2   Title Pt will be able to jog 2 miles with pain </= 2/10.    Status On-going      PT LONG TERM GOAL #3   Title Pt will be able to improve R knee fleixon to >/= 140 degrees with pain </= 1/10.    Status On-going                 Plan - 05/16/20 2778    Clinical Impression Statement Pt overall is noticing an improvement in knee pain and symptoms, and not quite to baseline.  Initiated plyometric and running activity today with minimal pain and tightness reported.  Recommended he trial run/walk progression over next week and see how he does.    Examination-Activity Limitations Squat;Stand;Stairs;Other    Examination-Participation Restrictions Other;Driving;Yard Work;Community Activity    Stability/Clinical Decision Making Stable/Uncomplicated    Rehab Potential Excellent    PT Frequency  1x / week    PT Duration 8 weeks    PT Treatment/Interventions Cryotherapy;Electrical Stimulation;Iontophoresis 4mg /ml Dexamethasone;Ultrasound;Gait training;Stair training;Functional mobility training;Neuromuscular re-education;Balance training;Therapeutic exercise;Therapeutic activities;Patient/family education;Manual techniques;Taping;Dry needling;Passive range of motion;Vasopneumatic Device    PT Next Visit Plan see how running/jogging went, continue per POC as indicated    PT Home Exercise Plan Access Code:    Consulted and Agree with Plan of Care Patient           Patient will benefit from skilled therapeutic intervention in order to improve the following deficits and impairments:  Pain, Decreased activity tolerance, Impaired flexibility, Decreased strength, Difficulty walking, Other (comment), Decreased range of motion  Visit Diagnosis: Acute pain of right knee  Stiffness of right knee, not elsewhere classified  Difficulty in walking, not elsewhere classified     Problem List Patient Active Problem  List   Diagnosis Date Noted  . Left groin pain 09/07/2016      09/09/2016, PT, DPT 05/16/20 9:30 AM     [facility] Physical Therapy [address] [place], [place], [place] Phone: [phone]   Fax:  [phone]  Name: Travis Wiggins MRN: Clement Sayres Date of Birth: May 21, 1981

## 2020-05-28 ENCOUNTER — Ambulatory Visit: Payer: BC Managed Care – PPO | Admitting: Physical Therapy

## 2020-05-28 ENCOUNTER — Encounter: Payer: Self-pay | Admitting: Physical Therapy

## 2020-05-28 ENCOUNTER — Other Ambulatory Visit: Payer: Self-pay

## 2020-05-28 DIAGNOSIS — M25661 Stiffness of right knee, not elsewhere classified: Secondary | ICD-10-CM

## 2020-05-28 DIAGNOSIS — M25561 Pain in right knee: Secondary | ICD-10-CM | POA: Diagnosis not present

## 2020-05-28 DIAGNOSIS — R262 Difficulty in walking, not elsewhere classified: Secondary | ICD-10-CM

## 2020-05-28 NOTE — Therapy (Addendum)
[facility] Physical Therapy [address] [place], [place], [address] Phone: [phone]   Fax:  [phone]  Physical Therapy Treatment/Discharge Summary  Patient Details  Name: Travis Wiggins MRN: [id number] Date of Birth: 28-Feb-1981 Referring Provider (PT): Lynne Leader, MD   Encounter Date: 05/28/2020   PT End of Session - 05/28/20 1333    Visit Number 6    Number of Visits 8    Date for PT Re-Evaluation 06/03/20    Authorization Type BCBS    PT Start Time 1300    PT Stop Time 1330    PT Time Calculation (min) 30 min    Activity Tolerance Patient tolerated treatment well    Behavior During Therapy [facility] for tasks assessed/performed           History reviewed. No pertinent past medical history.  History reviewed. No pertinent surgical history.  There were no vitals filed for this visit.   Subjective Assessment - 05/28/20 1258    Subjective did a little running over the past week - "it's feeling more normal."    Patient Stated Goals Continue to run without pain    Currently in Pain? No/denies    Pain Onset More than a month ago              [facility] PT Assessment - 05/28/20 1310      Assessment   Medical Diagnosis R knee pain, M25.561    Referring Provider (PT) Lynne Leader, MD      AROM   Right Knee Flexion 142                         [facility] Adult PT Treatment/Exercise - 05/28/20 1302      Knee/Hip Exercises: Stretches   Passive Hamstring Stretch Both;2 reps;30 seconds    Passive Hamstring Stretch Limitations supine with strap    Quad Stretch Right;3 reps;30 seconds    Quad Stretch Limitations prone    Piriformis Stretch Right;2 reps;30 seconds      Knee/Hip Exercises: Aerobic   Recumbent Bike L5 x 7 min      Iontophoresis   Type of Iontophoresis Dexamethasone    Location R ant/lateral knee    Dose 1.0 cc    Time 6 hour patch                       PT Long Term Goals - 05/28/20 1333      PT LONG TERM  GOAL #1   Title Pt will be independent in his HEP and progression.    Status Achieved      PT LONG TERM GOAL #2   Title Pt will be able to jog 2 miles with pain </= 2/10.    Baseline 9/7: up to 1/2 mile at this time, no pain    Status Partially Met      PT LONG TERM GOAL #3   Title Pt will be able to improve R knee fleixon to >/= 140 degrees with pain </= 1/10.    Status Achieved                 Plan - 05/28/20 1333    Clinical Impression Statement Pt has met 2/3 LTGs and is slowly progressing his running towards LTG #2.  Each run has been an improvement in symptoms and distance and feel overall he is progressing well.  Discussed strengthening exercises at the gym as he's independent with gym progression  and encouraged regular stretching.  Will hold PT x 30 days, recommended if swelling persists to follow up with MD.    Examination-Activity Limitations Squat;Stand;Stairs;Other    Examination-Participation Restrictions Other;Driving;Yard Work;Community Activity    Stability/Clinical Decision Making Stable/Uncomplicated    Rehab Potential Excellent    PT Frequency 1x / week    PT Duration 8 weeks    PT Treatment/Interventions Cryotherapy;Electrical Stimulation;Iontophoresis 90m/ml Dexamethasone;Ultrasound;Gait training;Stair training;Functional mobility training;Neuromuscular re-education;Balance training;Therapeutic exercise;Therapeutic activities;Patient/family education;Manual techniques;Taping;Dry needling;Passive range of motion;Vasopneumatic Device    PT Next Visit Plan hold x 30 days, d/c if doesn't return, recert if he returns    PT Home Exercise Plan Access Code: [email or web address]   Consulted and Agree with Plan of Care Patient           Patient will benefit from skilled therapeutic intervention in order to improve the following deficits and impairments:  Pain, Decreased activity tolerance, Impaired flexibility, Decreased strength, Difficulty walking, Other (comment), Decreased  range of motion  Visit Diagnosis: Acute pain of right knee  Stiffness of right knee, not elsewhere classified  Difficulty in walking, not elsewhere classified     Problem List Patient Active Problem List   Diagnosis Date Noted  . Left groin pain 09/07/2016      SLaureen Abrahams PT, DPT 05/28/20 1:36 PM     [facility] [address] [address]Phone: [phone]  Fax:  [phone] Name: Travis WeltonMRN: [id number] of Birth: 603/23/1982   PHYSICAL THERAPY DISCHARGE SUMMARY  Visits from Start of Care: 6  Current functional level related to goals / functional outcomes: See above   Remaining deficits: See above   Education / Equipment: HEP  Plan: Patient agrees to discharge.  Patient goals were partially met. Patient is being discharged due to being pleased with the current functional level.  ?????    SLaureen Abrahams PT, DPT 07/09/20 9:08 AM  [facility] [address] [address]Phone: [phone]  Fax:  [phone]

## 2020-05-31 DIAGNOSIS — F4322 Adjustment disorder with anxiety: Secondary | ICD-10-CM | POA: Diagnosis not present

## 2020-06-03 DIAGNOSIS — F4322 Adjustment disorder with anxiety: Secondary | ICD-10-CM | POA: Diagnosis not present

## 2020-06-07 DIAGNOSIS — F4322 Adjustment disorder with anxiety: Secondary | ICD-10-CM | POA: Diagnosis not present

## 2020-06-13 DIAGNOSIS — F4322 Adjustment disorder with anxiety: Secondary | ICD-10-CM | POA: Diagnosis not present

## 2020-06-27 DIAGNOSIS — F4322 Adjustment disorder with anxiety: Secondary | ICD-10-CM | POA: Diagnosis not present

## 2020-07-04 DIAGNOSIS — F4322 Adjustment disorder with anxiety: Secondary | ICD-10-CM | POA: Diagnosis not present

## 2020-07-11 DIAGNOSIS — F4322 Adjustment disorder with anxiety: Secondary | ICD-10-CM | POA: Diagnosis not present

## 2020-07-18 DIAGNOSIS — F4322 Adjustment disorder with anxiety: Secondary | ICD-10-CM | POA: Diagnosis not present

## 2020-08-01 DIAGNOSIS — F4322 Adjustment disorder with anxiety: Secondary | ICD-10-CM | POA: Diagnosis not present

## 2020-08-08 DIAGNOSIS — F4322 Adjustment disorder with anxiety: Secondary | ICD-10-CM | POA: Diagnosis not present

## 2020-08-09 NOTE — Progress Notes (Signed)
Travis Wiggins is a 39 y.o. male who presents to [facility] at [facility] today for f/u of R knee pain and swelling.  He was last seen by Dr. Denyse Amass on 03/28/20 and reported acute R post-lat knee pain x approximately 2 weeks at that point.  He was referred to PT and has completed 6 visits and has been d/c.  He was also advised to purchase a knee compression sleeve.  Since his last visit, pt reports that he con't to have tightness and swelling in his R knee.  He con't to have some discomfort when he runs but doesn't seem to notice that the swelling gets worse w/ running.  He states that his R knee feels "unstable and weird" despite being able to do a lot of activities including running, riding his bike and weight lifting.  He locates his swelling mainly to his R ant-lateral knee.   Pertinent review of systems: No fevers or chills  Relevant historical information: Healthy otherwise   Exam:  BP 102/72 (BP Location: Left Arm, Patient Position: Sitting, Cuff Size: Large)   Pulse 79   Ht 5\' 11"  (1.803 m)   Wt 201 lb 3.2 oz (91.3 kg)   SpO2 97%   BMI 28.06 kg/m  General: Well Developed, well nourished, and in no acute distress.   MSK: Right knee moderate effusion otherwise normal-appearing Normal motion with crepitation. Stable ligamentous exam. Minimally positive medial and lateral McMurray's test. Intact strength of flexion and extension    Lab and Radiology Results  X-ray images right knee obtained today personally and independently interpreted No fractures or malformation.  Minimal degenerative changes. Await formal radiology review  Procedure: Real-time Ultrasound Guided Injection of right knee superior patellar space Device: Philips Affiniti 50G Images permanently stored and available for review in PACS Ultrasound examination of knee prior to injection reveals a moderate effusion and degenerative appearing lateral and medial meniscus.  No Baker's cyst. Verbal  informed consent obtained.  Discussed risks and benefits of procedure. Warned about infection bleeding damage to structures skin hypopigmentation and fat atrophy among others. Patient expresses understanding and agreement Time-out conducted.   Noted no overlying erythema, induration, or other signs of local infection.   Skin prepped in a sterile fashion.   Local anesthesia: Topical Ethyl chloride.   With sterile technique and under real time ultrasound guidance:  40 mg of Kenalog and 2 mL of Marcaine injected into knee. Fluid seen entering the joint capsule.   Completed without difficulty   Pain immediately resolved suggesting accurate placement of the medication.   Advised to call if fevers/chills, erythema, induration, drainage, or persistent bleeding.   Images permanently stored and available for review in the ultrasound unit.  Impression: Technically successful ultrasound guided injection.       Assessment and Plan: 39 y.o. male with persistent swelling and dysfunction right knee ongoing for greater than 6 months now.  Patient had great trial of physical therapy that helped quite a lot but still having some mechanical symptoms of dysfunction including knee swelling.  Discussed options.  He would like to avoid surgery if possible.  Plan to continue his conservative management.  We will proceed with steroid injection today and obtain x-ray today.  If not improving patient will let me know would proceed with MRI for surgical planning.   PDMP not reviewed this encounter. Orders Placed This Encounter  Procedures  . DG Knee AP/LAT W/Sunrise Right    Standing Status:   Future  Number of Occurrences:   1    Standing Expiration Date:   08/12/2021    Order Specific Question:   Reason for Exam (SYMPTOM  OR DIAGNOSIS REQUIRED)    Answer:   right knee pain    Order Specific Question:   Preferred imaging location?    Answer:   [facility]  . [place] LIMITED JOINT SPACE STRUCTURES LOW  RIGHT(NO LINKED CHARGES)    Order Specific Question:   Reason for Exam (SYMPTOM  OR DIAGNOSIS REQUIRED)    Answer:   R knee pain    Order Specific Question:   Preferred imaging location?    Answer:   [facility]   No orders of the defined types were placed in this encounter.    Discussed warning signs or symptoms. Please see discharge instructions. Patient expresses understanding.   The above documentation has been reviewed and is accurate and complete [name], M.D.

## 2020-08-12 ENCOUNTER — Other Ambulatory Visit: Payer: Self-pay

## 2020-08-12 ENCOUNTER — Ambulatory Visit (INDEPENDENT_AMBULATORY_CARE_PROVIDER_SITE_OTHER): Payer: BC Managed Care – PPO | Admitting: Family Medicine

## 2020-08-12 ENCOUNTER — Ambulatory Visit (INDEPENDENT_AMBULATORY_CARE_PROVIDER_SITE_OTHER): Payer: BC Managed Care – PPO

## 2020-08-12 ENCOUNTER — Encounter: Payer: Self-pay | Admitting: Family Medicine

## 2020-08-12 ENCOUNTER — Ambulatory Visit: Payer: Self-pay

## 2020-08-12 VITALS — BP 102/72 | HR 79 | Ht 71.0 in | Wt 201.2 lb

## 2020-08-12 DIAGNOSIS — M25561 Pain in right knee: Secondary | ICD-10-CM

## 2020-08-12 DIAGNOSIS — M25461 Effusion, right knee: Secondary | ICD-10-CM

## 2020-08-12 NOTE — Patient Instructions (Signed)
Thank you for coming in today.  Plan for xray today and injection.   Let me know if this does not help enough.  Next step is MRI.  Let me know.   Call or go to the ER if you develop a large red swollen joint with extreme pain or oozing puss.

## 2020-08-13 NOTE — Progress Notes (Signed)
X-ray knee shows no arthritis.  Small amount of fluid present in the knee joint but otherwise normal.

## 2020-08-14 DIAGNOSIS — B078 Other viral warts: Secondary | ICD-10-CM | POA: Diagnosis not present

## 2020-08-22 DIAGNOSIS — F4322 Adjustment disorder with anxiety: Secondary | ICD-10-CM | POA: Diagnosis not present

## 2020-08-29 DIAGNOSIS — F4322 Adjustment disorder with anxiety: Secondary | ICD-10-CM | POA: Diagnosis not present

## 2020-09-05 DIAGNOSIS — F4322 Adjustment disorder with anxiety: Secondary | ICD-10-CM | POA: Diagnosis not present

## 2020-10-03 DIAGNOSIS — F4322 Adjustment disorder with anxiety: Secondary | ICD-10-CM | POA: Diagnosis not present

## 2020-10-21 DIAGNOSIS — F4322 Adjustment disorder with anxiety: Secondary | ICD-10-CM | POA: Diagnosis not present

## 2020-11-28 DIAGNOSIS — F4322 Adjustment disorder with anxiety: Secondary | ICD-10-CM | POA: Diagnosis not present

## 2020-12-10 DIAGNOSIS — F4322 Adjustment disorder with anxiety: Secondary | ICD-10-CM | POA: Diagnosis not present

## 2020-12-21 IMAGING — DX DG KNEE AP/LAT W/ SUNRISE*R*
3 series · 3 of 3 positions shown · non-contrast
Comparison: None.

CLINICAL DATA: Right knee pain and swelling over the last 6 months.

EXAM:
RIGHT KNEE 3 VIEWS

[knee ap]
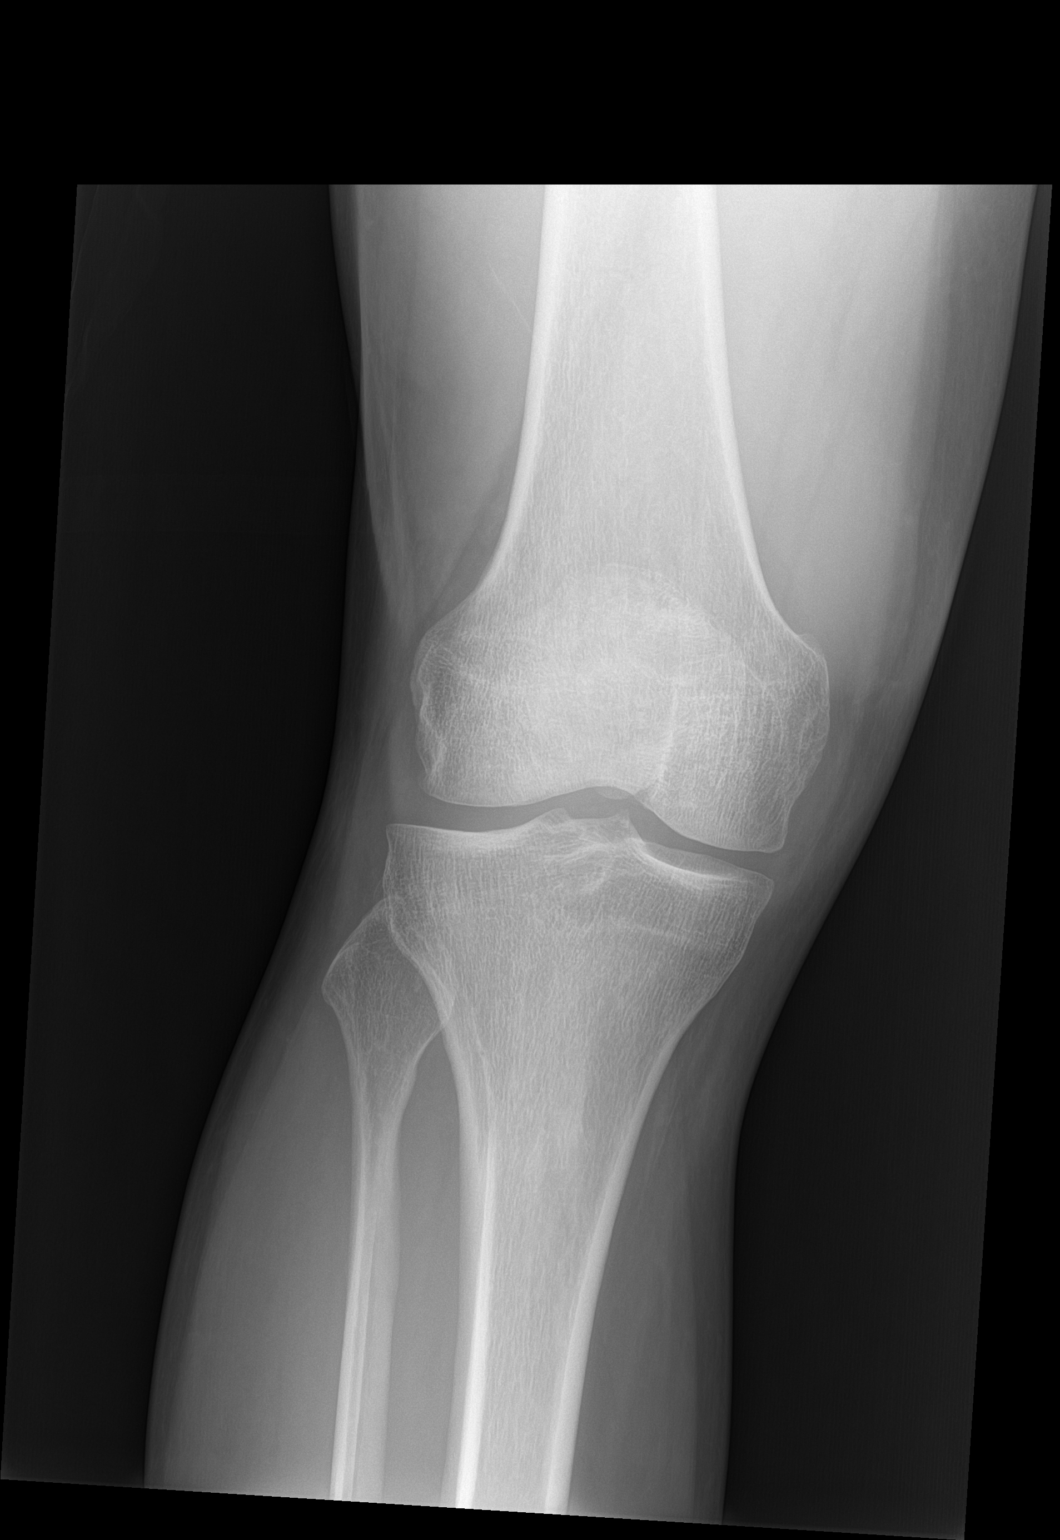

[knee lat]
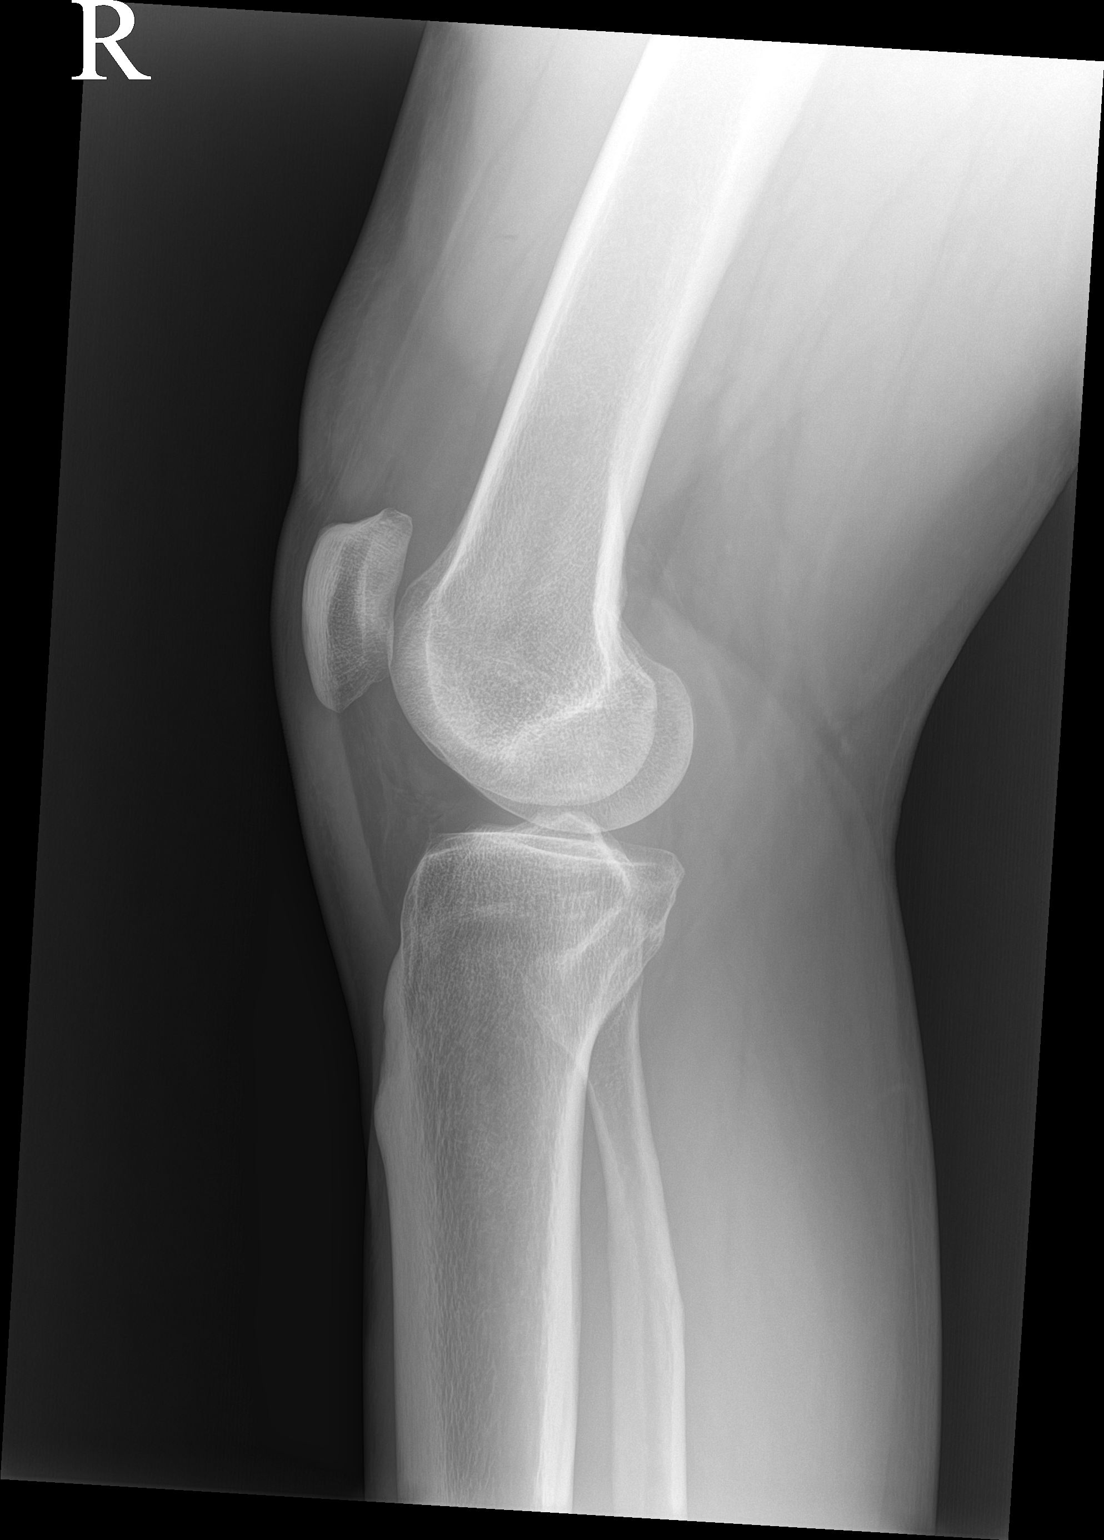

[patella]
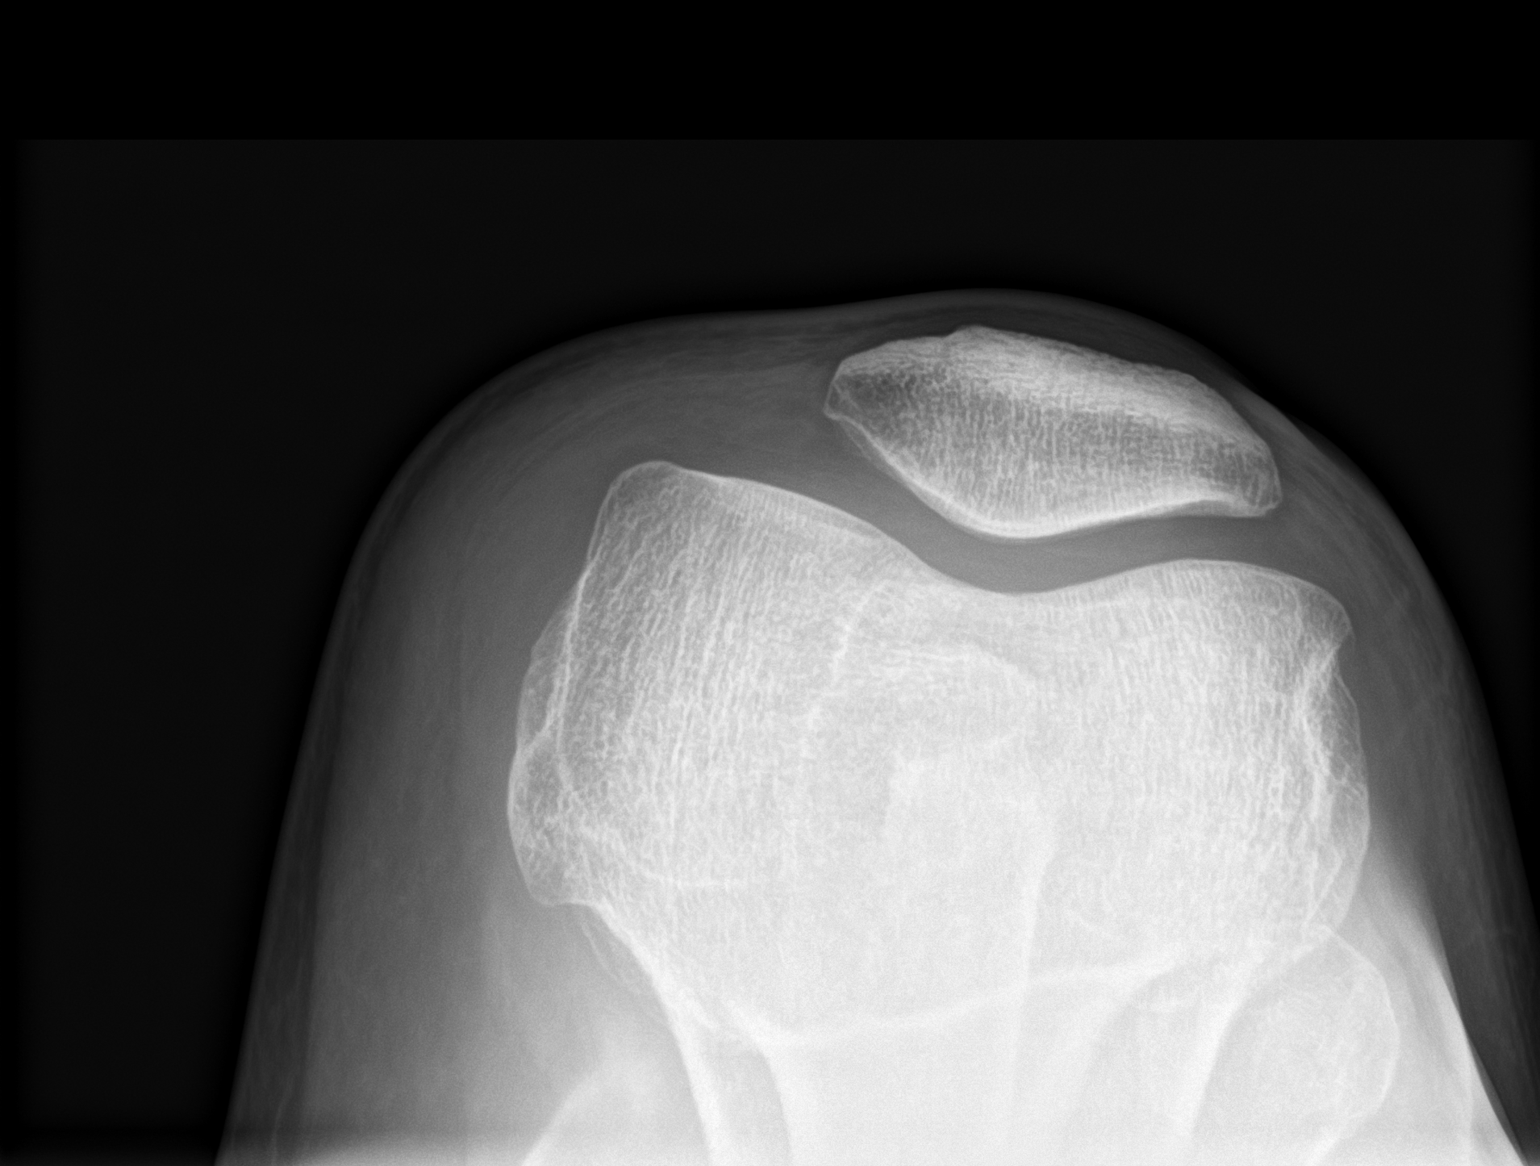

[3 of 3 positions shown; findings below may reference images not displayed]

FINDINGS: Small knee joint effusion. No joint space narrowing. No osteophyte
formation. No focal bone lesion.
IMPRESSION: Small knee joint effusion. No other finding.

## 2020-12-26 DIAGNOSIS — F4322 Adjustment disorder with anxiety: Secondary | ICD-10-CM | POA: Diagnosis not present

## 2021-01-15 DIAGNOSIS — F4322 Adjustment disorder with anxiety: Secondary | ICD-10-CM | POA: Diagnosis not present

## 2021-02-06 NOTE — Progress Notes (Signed)
I, [name], LAT, ATC, am serving as scribe for Dr. [name].  Travis Wiggins is a 40 y.o. male who presents to [facility] at [facility] today for f/u of recurrent R knee pain and swelling.  He was last seen by Dr. Denyse Amass on 08/12/20 and noted con't swelling and tightness in his R knee but was able to return to some limited running.  He had a R knee injection at this last visit.  He completed a prior course of PT and wears a knee compression sleeve w/ activity.  Since his last visit, pt reports knee starting bothering him again about a month ago. Pt has been doing a lot of traveling and hiking recently. Pt reports pain and swelling has really gotten bad over the last 2 weeks. Pt notes increased swelling and pressure when flexing knee.  Some clicking and clunking present in the knee.   Diagnostic testing: R knee XR- 08/12/20   Pertinent review of systems: No fevers or chills  Relevant historical information: Otherwise healthy   Exam:  BP 114/74 (BP Location: Right Arm, Patient Position: Sitting, Cuff Size: Normal)   Pulse 78   Ht 5\' 11"  (1.803 m)   Wt 199 lb 6.4 oz (90.4 kg)   SpO2 98%   BMI 27.81 kg/m  General: Well Developed, well nourished, and in no acute distress.   MSK: Right knee moderate effusion normal motion.  Stable ligamentous exam mildly positive McMurray's test.    Lab and Radiology Results EXAM: RIGHT KNEE 3 VIEWS  COMPARISON:  None.  FINDINGS: Small knee joint effusion. No joint space narrowing. No osteophyte formation. No focal bone lesion.  IMPRESSION: Small knee joint effusion. No other finding.   Electronically Signed   By: M.D.   On: 08/12/2020 15:43 I, 08/14/2020, personally (independently) visualized and performed the interpretation of the images attached in this note.  Procedure: Real-time Ultrasound Guided Injection of right knee superior lateral patellar space Device: Philips Affiniti 50G Images  permanently stored and available for review in PACS Verbal informed consent obtained.  Discussed risks and benefits of procedure. Warned about infection bleeding damage to structures skin hypopigmentation and fat atrophy among others. Patient expresses understanding and agreement Time-out conducted.   Noted no overlying erythema, induration, or other signs of local infection.   Skin prepped in a sterile fashion.   Local anesthesia: Topical Ethyl chloride.   With sterile technique and under real time ultrasound guidance:  40 mg of Kenalog and 2 mL of Marcaine injected into knee joint. Fluid seen entering the joint capsule.   Completed without difficulty   Pain immediately resolved suggesting accurate placement of the medication.   Advised to call if fevers/chills, erythema, induration, drainage, or persistent bleeding.   Images permanently stored and available for review in the ultrasound unit.  Impression: Technically successful ultrasound guided injection.         Assessment and Plan: 40 y.o. male with right knee pain due to concern for meniscus tear.  Patient has recurrent knee pain and effusions with mechanical symptoms.  Plan for MRI.  Would not proceed with steroid injection now to manage pain and effusion.  Assuming expected meniscus tear surgical problem seen on MRI will refer directly to orthopedic surgery.  If surprised recheck back in clinic.   PDMP not reviewed this encounter. Orders Placed This Encounter  Procedures  . 24 LIMITED JOINT SPACE STRUCTURES LOW RIGHT(NO LINKED CHARGES)    Standing Status:  Future    Number of Occurrences:   1    Standing Expiration Date:   08/10/2021    Order Specific Question:   Reason for Exam (SYMPTOM  OR DIAGNOSIS REQUIRED)    Answer:   right knee pain    Order Specific Question:   Preferred imaging location?    Answer:   Adult nurse [facility]  . MR Knee Right Wo Contrast    Standing Status:   Future    Standing  Expiration Date:   02/07/2022    Order Specific Question:   What is the patient's sedation requirement?    Answer:   No Sedation    Order Specific Question:   Does the patient have a pacemaker or implanted devices?    Answer:   No    Order Specific Question:   Preferred imaging location?    Answer:   [facility] (table limit-350lbs)   No orders of the defined types were placed in this encounter.    Discussed warning signs or symptoms. Please see discharge instructions. Patient expresses understanding.   The above documentation has been reviewed and is accurate and complete [name], M.D.

## 2021-02-07 ENCOUNTER — Other Ambulatory Visit: Payer: Self-pay

## 2021-02-07 ENCOUNTER — Ambulatory Visit: Payer: Self-pay

## 2021-02-07 ENCOUNTER — Ambulatory Visit: Payer: BC Managed Care – PPO | Admitting: Family Medicine

## 2021-02-07 VITALS — BP 114/74 | HR 78 | Ht 71.0 in | Wt 199.4 lb

## 2021-02-07 DIAGNOSIS — M25561 Pain in right knee: Secondary | ICD-10-CM | POA: Diagnosis not present

## 2021-02-07 NOTE — Patient Instructions (Addendum)
Thank you for coming in today.  Call or go to the ER if you develop a large red swollen joint with extreme pain or oozing puss.   You should hear from MRI scheduling within 1 week. If you do not hear please let me know.   Recheck after the MRI unless there is an obvious surgical issues in which I will likely refer directly to orthopedics for consultation.

## 2021-02-11 DIAGNOSIS — Z202 Contact with and (suspected) exposure to infections with a predominantly sexual mode of transmission: Secondary | ICD-10-CM | POA: Diagnosis not present

## 2021-02-16 ENCOUNTER — Other Ambulatory Visit: Payer: Self-pay

## 2021-02-16 ENCOUNTER — Ambulatory Visit (INDEPENDENT_AMBULATORY_CARE_PROVIDER_SITE_OTHER): Payer: BC Managed Care – PPO

## 2021-02-16 DIAGNOSIS — M25561 Pain in right knee: Secondary | ICD-10-CM | POA: Diagnosis not present

## 2021-02-18 NOTE — Progress Notes (Signed)
Right knee MRI shows some cartilage fissuring and bone bruising especially around the kneecap.  No meniscus or ligament injury in the knee.  Recommend return to clinic to go over the results in full detail.

## 2021-02-20 ENCOUNTER — Ambulatory Visit: Payer: BC Managed Care – PPO | Admitting: Family Medicine

## 2021-02-20 ENCOUNTER — Encounter: Payer: Self-pay | Admitting: Family Medicine

## 2021-02-20 ENCOUNTER — Other Ambulatory Visit: Payer: Self-pay

## 2021-02-20 VITALS — BP 120/70 | HR 70 | Ht 71.0 in | Wt 199.6 lb

## 2021-02-20 DIAGNOSIS — M25561 Pain in right knee: Secondary | ICD-10-CM | POA: Diagnosis not present

## 2021-02-20 DIAGNOSIS — M2241 Chondromalacia patellae, right knee: Secondary | ICD-10-CM | POA: Diagnosis not present

## 2021-02-20 NOTE — Patient Instructions (Signed)
Thank you for coming in today.  The issue is chondromalacia patella.   I recommend using the gel shots as the next step. That takes a few weeks to authorize. Let me know when you are ready.  They typically last 6 months or longer.

## 2021-02-20 NOTE — Progress Notes (Signed)
   I, Travis Wiggins, LAT, ATC, am serving as scribe for Dr. [name].  Travis Wiggins is a 40 y.o. male who presents to [facility] at [facility] today for f/u R knee pain and MRI review. Pt was last seen by Dr. Denyse Amass on 02/07/21 and was given a R knee steroid injection and was advised to proceed to MRI. Today, pt reports that his R knee pain is feeling much better since having the injection at his last visit.  He states that he feels like his swelling has resolved.  Dx imaging: 02/16/21 R knee MRI  08/12/20 R knee XR   Pertinent review of systems: No fevers or chills  Relevant historical information: Otherwise healthy   Exam:  BP 120/70 (BP Location: Right Arm, Patient Position: Sitting, Cuff Size: Normal)   Pulse 70   Ht 5\' 11"  (1.803 m)   Wt 199 lb 9.6 oz (90.5 kg)   SpO2 98%   BMI 27.84 kg/m  General: Well Developed, well nourished, and in no acute distress.   MSK: Right knee normal-appearing normal motion    Lab and Radiology Results  EXAM: MRI OF THE RIGHT KNEE WITHOUT CONTRAST  TECHNIQUE: Multiplanar, multisequence MR imaging of the knee was performed. No intravenous contrast was administered.  COMPARISON:  None.  FINDINGS: MENISCI  Medial: Intact.  Lateral: Intact.  LIGAMENTS  Cruciates: ACL and PCL are intact.  Collaterals: Medial collateral ligament is intact. Lateral collateral ligament complex is intact.  CARTILAGE  Patellofemoral: Cartilage fissuring of the lateral trochlea with subchondral reactive marrow edema. Cartilage fissuring of the patellar apex and medial patellar facet.  Medial:  No chondral defect.  Lateral:  No chondral defect.  JOINT: Small joint effusion. Normal Hoffa's fat-pad. No plical thickening.  POPLITEAL FOSSA: Popliteus tendon is intact. No Baker's cyst.  EXTENSOR MECHANISM: Intact quadriceps tendon. Intact patellar tendon. Intact lateral patellar retinaculum. Intact medial  patellar retinaculum. Intact MPFL.  BONES: No aggressive osseous lesion. No fracture or dislocation.  Other: No fluid collection or hematoma. Muscles are normal.  IMPRESSION: 1. Cartilage fissuring of the lateral trochlea with subchondral reactive marrow edema. Cartilage fissuring of the patellar apex and medial patellar facet. 2. No meniscal or ligamentous injury of right knee.   Electronically Signed   By:   On: 02/16/2021 16:54 I, 02/18/2021, personally (independently) visualized and performed the interpretation of the images attached in this note.     Assessment and Plan: 40 y.o. male with right knee pain due to chondromalacia patella seen on MRI.  Patient had good response to steroid injection however I do not anticipate this response will be lifelong.  Would like to avoid chronic repeated steroid injections to manage this problem in the future.  Hyaluronic acid injections may be a better solution.  Patient will let me know when his pain returns and I will work on authorization for hyaluronic acid injections and we will try that next.  Also continue to work on 24.  Recheck as needed.    Discussed warning signs or symptoms. Please see discharge instructions. Patient expresses understanding.   The above documentation has been reviewed and is accurate and complete Dance movement psychotherapist, M.D.  Total encounter time 20 minutes including face-to-face time with the patient and, reviewing past medical record, and charting on the date of service.   Treatment plan and options.

## 2021-02-21 DIAGNOSIS — F4322 Adjustment disorder with anxiety: Secondary | ICD-10-CM | POA: Diagnosis not present

## 2021-02-21 DIAGNOSIS — H16141 Punctate keratitis, right eye: Secondary | ICD-10-CM | POA: Diagnosis not present

## 2021-02-28 DIAGNOSIS — F4322 Adjustment disorder with anxiety: Secondary | ICD-10-CM | POA: Diagnosis not present

## 2021-03-06 DIAGNOSIS — F4322 Adjustment disorder with anxiety: Secondary | ICD-10-CM | POA: Diagnosis not present

## 2021-03-20 DIAGNOSIS — F4322 Adjustment disorder with anxiety: Secondary | ICD-10-CM | POA: Diagnosis not present

## 2021-03-28 ENCOUNTER — Ambulatory Visit: Payer: BC Managed Care – PPO | Admitting: Family Medicine

## 2021-04-01 ENCOUNTER — Ambulatory Visit: Payer: BC Managed Care – PPO | Admitting: Family Medicine

## 2021-04-09 NOTE — Progress Notes (Deleted)
   I, [name], LAT, ATC, am serving as scribe for Dr. [name].  Manjot Hinks is a 40 y.o. male who presents to [facility] at [facility] today for f/u of R knee pain and new L knee pain.  He was last seen by Dr. Denyse Amass on 02/20/21 and was doing much better after having a steroid injection on 02/07/21.  Authorization was sought for possible R knee gel injections but his insurance would not authorize these injections.  He was also advised to con't working on quad strengthening.  Since his last visit, pt reports   Diagnostic imaging: R knee MRI- 02/16/21; R knee XR- 08/12/20  Pertinent review of systems: ***  Relevant historical information: ***   Exam:  There were no vitals taken for this visit. General: Well Developed, well nourished, and in no acute distress.   MSK: ***    Lab and Radiology Results No results found for this or any previous visit (from the past 72 hour(s)). No results found.     Assessment and Plan: 40 y.o. male with ***   PDMP not reviewed this encounter. No orders of the defined types were placed in this encounter.  No orders of the defined types were placed in this encounter.    Discussed warning signs or symptoms. Please see discharge instructions. Patient expresses understanding.   ***

## 2021-04-10 ENCOUNTER — Ambulatory Visit: Payer: BC Managed Care – PPO | Admitting: Family Medicine

## 2021-04-28 NOTE — Progress Notes (Signed)
I, [name], LAT, ATC, am serving as scribe for Dr. [name].  Travis Wiggins is a 40 y.o. male who presents to [facility] at [facility] today for f/u of R knee pain and Zilretta injection and for evaluation of L knee pain/swelling.  He was last seen by Dr. Denyse Amass on 02/20/21 for R knee f/u and noted marked improvement in his R knee pain following a steroid injection on 02/07/21.  He also reviewed his R knee MRI and a request for authorization of R knee gel injections was placed.  Since then, pt reports that his R knee is feeling relatively ok.  He states that it is slightly swollen but not near what it was before.  He has been running but doesn't run more than 2.5 miles at a time.  His L knee started to become painful and swollen about 6 weeks ago w/ no known MOI.  L knee pain: Present x approximately 6 weeks w/ no known MOI -Swelling: yes -Aggravating factors: L LE weight-bearing activity; squats -Treatments tried: ice; IBU  Diagnostic testing: R knee MRI- 02/16/21; R knee XR- 08/12/20  Pertinent review of systems: No fevers or chills  Relevant historical information: Otherwise healthy   Exam:  BP 120/76 (BP Location: Left Arm, Patient Position: Sitting, Cuff Size: Normal)   Pulse 88   Ht 5\' 11"  (1.803 m)   Wt 199 lb 6.4 oz (90.4 kg)   SpO2 99%   BMI 27.81 kg/m  General: Well Developed, well nourished, and in no acute distress.   MSK: Right knee no effusion normal motion normal strength. Left knee mild effusion normal motion with crepitation tender palpation medial joint line stable ligamentous exam intact strength.    Lab and Radiology Results  Procedure: Real-time Ultrasound Guided Injection of right knee Zilretta Device: Philips Affiniti 50G Images permanently stored and available for review in PACS Verbal informed consent obtained.  Discussed risks and benefits of procedure. Warned about infection bleeding damage to structures skin hypopigmentation and  fat atrophy among others. Patient expresses understanding and agreement Time-out conducted.   Noted no overlying erythema, induration, or other signs of local infection.   Skin prepped in a sterile fashion.   Local anesthesia: Topical Ethyl chloride.   With sterile technique and under real time ultrasound guidance: Zilretta 32 mg injected into right knee joint. Fluid seen entering the joint capsule.   Completed without difficulty   Advised to call if fevers/chills, erythema, induration, drainage, or persistent bleeding.   Images permanently stored and available for review in the ultrasound unit.  Impression: Technically successful ultrasound guided injection.  Diagnostic Limited MSK Ultrasound of: Left knee Quad tendon intact. Mild joint effusion. Patellar tendon intact. Slight narrowing medial joint line. Normal lateral joint line. No Baker's cyst. Impression: Mild medial compartment DJD      Assessment and Plan: 40 y.o. male with acute left knee pain.  This is a newer issue.  Very likely same as right knee but less so.  Plan for quad strengthening and Voltaren gel.  Certainly happy to proceed with steroid injection left knee if needed in the future.  Proceed with planned Zilretta injection right knee.   PDMP not reviewed this encounter. Orders Placed This Encounter  Procedures   41 LIMITED JOINT SPACE STRUCTURES LOW RIGHT(NO LINKED CHARGES)    Order Specific Question:   Reason for Exam (SYMPTOM  OR DIAGNOSIS REQUIRED)    Answer:   R knee pain    Order Specific  Question:   Preferred imaging location?    Answer:   [place]   No orders of the defined types were placed in this encounter.    Discussed warning signs or symptoms. Please see discharge instructions. Patient expresses understanding.   The above documentation has been reviewed and is accurate and complete Lynne Leader, M.D.

## 2021-04-29 ENCOUNTER — Ambulatory Visit: Payer: Self-pay

## 2021-04-29 ENCOUNTER — Encounter: Payer: Self-pay | Admitting: Family Medicine

## 2021-04-29 ENCOUNTER — Ambulatory Visit: Payer: BC Managed Care – PPO | Admitting: Family Medicine

## 2021-04-29 ENCOUNTER — Other Ambulatory Visit: Payer: Self-pay

## 2021-04-29 VITALS — BP 120/76 | HR 88 | Ht 71.0 in | Wt 199.4 lb

## 2021-04-29 DIAGNOSIS — G8929 Other chronic pain: Secondary | ICD-10-CM

## 2021-04-29 DIAGNOSIS — M25562 Pain in left knee: Secondary | ICD-10-CM

## 2021-04-29 DIAGNOSIS — M25561 Pain in right knee: Secondary | ICD-10-CM

## 2021-04-29 NOTE — Patient Instructions (Addendum)
Good to see you today.  You had a R knee Zilretta injection.  Call or go to the ER if you develop a large red swollen joint with extreme pain or oozing puss.   Continue the home exercises you learned for the right knee in the left.  I can do an injection in the left if needed.

## 2021-05-01 DIAGNOSIS — F4322 Adjustment disorder with anxiety: Secondary | ICD-10-CM | POA: Diagnosis not present

## 2021-06-27 IMAGING — MR MR KNEE*R* W/O CM
7 series · 40 of 40 positions shown · non-contrast
Comparison: None.

CLINICAL DATA: Right knee pain and swelling.

EXAM:
MRI OF THE RIGHT KNEE WITHOUT CONTRAST
TECHNIQUE: Multiplanar, multisequence MR imaging of the knee was performed. No
intravenous contrast was administered.

[Series 3: T2 fat-sat · axial · 4.0mm · 0.47mm/px · z∈[-74,+70]mm · 6 of 30 slices shown (1 of 3)]
[im 1/30]
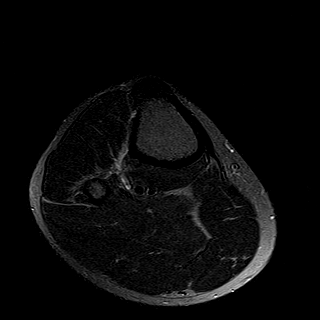
[im 6/30]
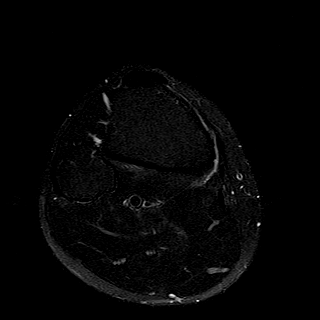
[im 12/30]
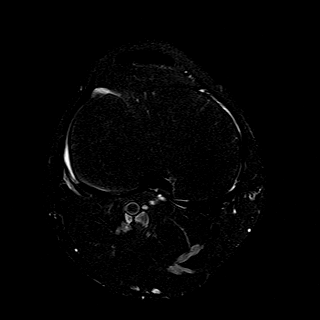
[im 18/30]
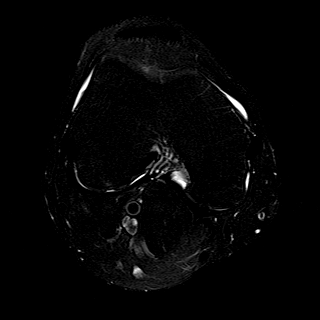
[im 24/30]
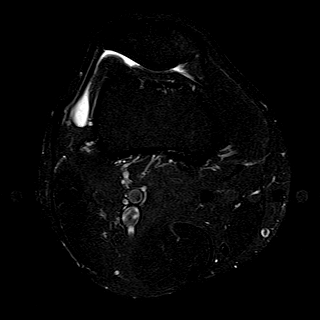
[im 30/30]
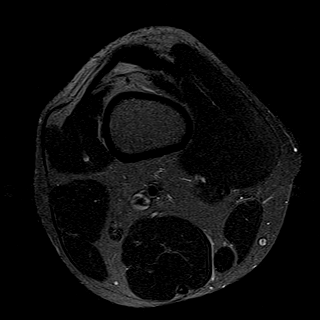

[Series 4: T1 · coronal · 4.0mm · 0.59mm/px · 6 of 30 slices shown]
[im 1/30]
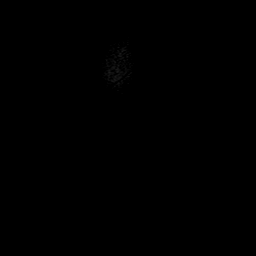
[im 6/30]
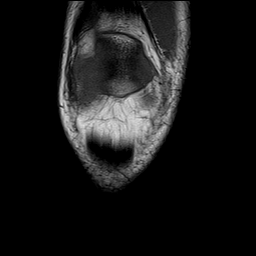
[im 12/30]
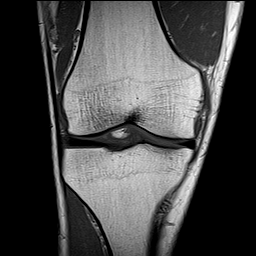
[im 18/30]
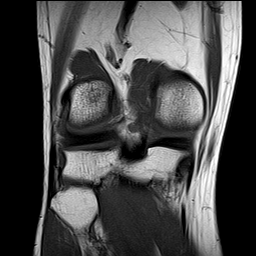
[im 24/30]
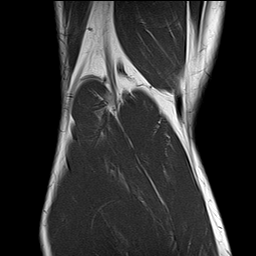
[im 30/30]
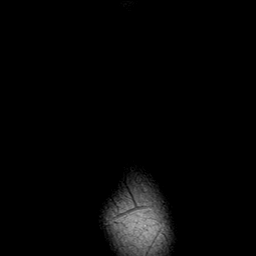

[Series 5: T2 fat-sat · coronal · 4.0mm · 0.59mm/px · 6 of 30 slices shown (2 of 3)]
[im 1/30]
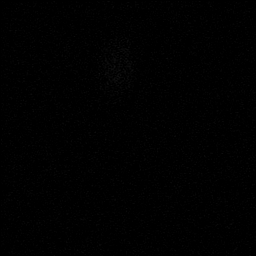
[im 6/30]
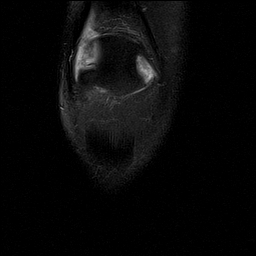
[im 12/30]
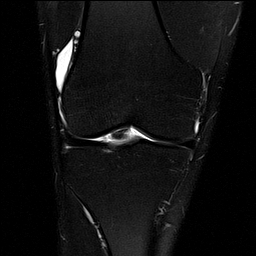
[im 18/30]
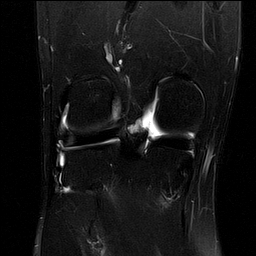
[im 24/30]
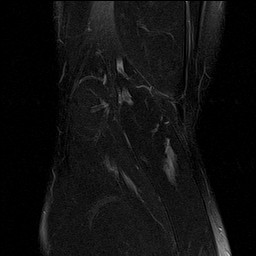
[im 30/30]
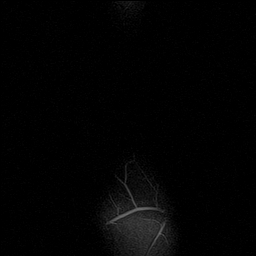

[Series 6: PD fat-sat · coronal · 4.0mm · 0.59mm/px · 6 of 30 slices shown (1 of 3)]
[im 1/30]
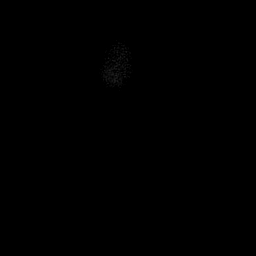
[im 6/30]
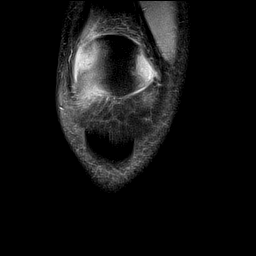
[im 12/30]
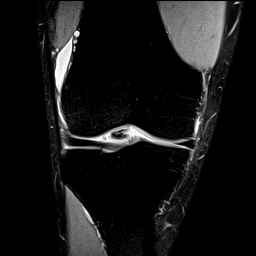
[im 18/30]
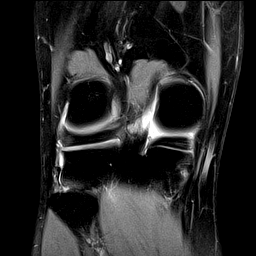
[im 24/30]
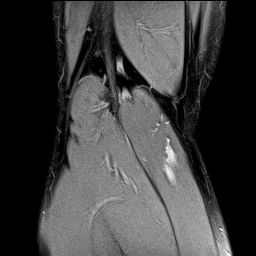
[im 30/30]
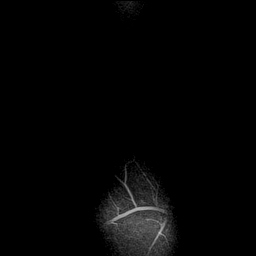

[Series 7: PD fat-sat · sagittal · 3.0mm · 0.59mm/px · 6 of 30 slices shown (2 of 3)]
[im 1/30]
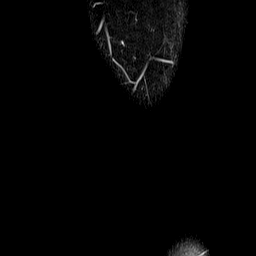
[im 6/30]
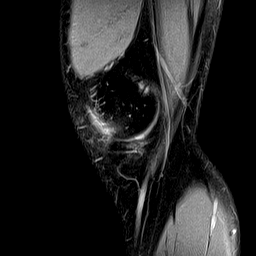
[im 12/30]
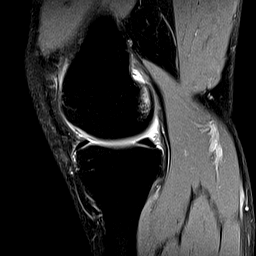
[im 18/30]
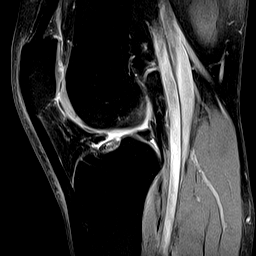
[im 24/30]
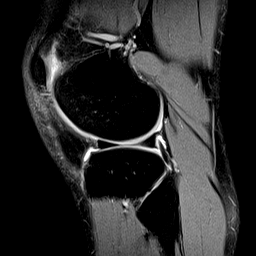
[im 30/30]
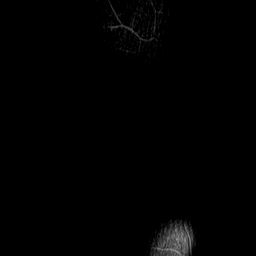

[Series 8: T2 fat-sat · sagittal · 3.0mm · 0.59mm/px · 6 of 30 slices shown (3 of 3)]
[im 1/30]
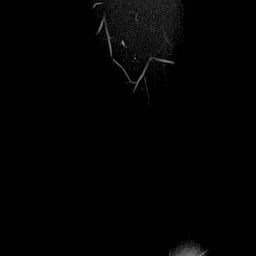
[im 6/30]
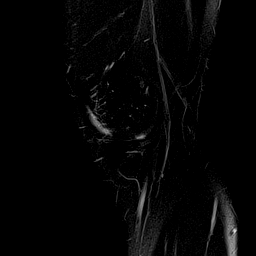
[im 12/30]
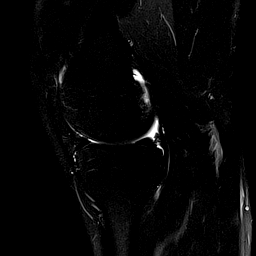
[im 18/30]
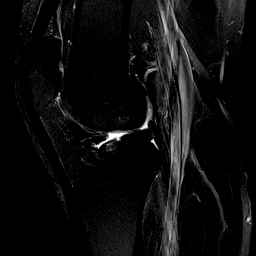
[im 24/30]
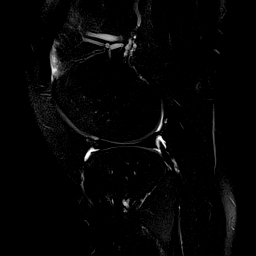
[im 30/30]
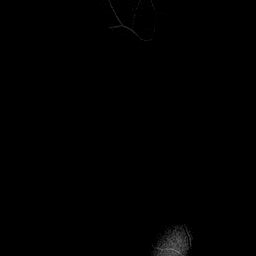

[Series 9: PD fat-sat · coronal · 2.0mm · 0.62mm/px · 4 of 21 slices shown (3 of 3)]
[im 1/21]
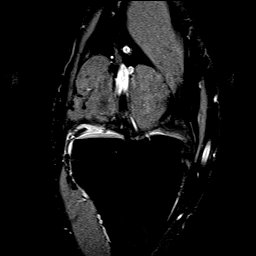
[im 7/21]
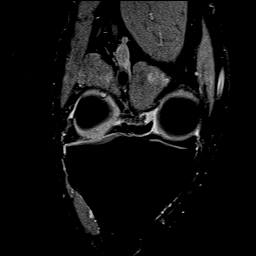
[im 14/21]
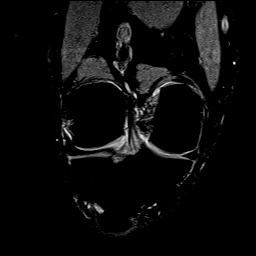
[im 21/21]
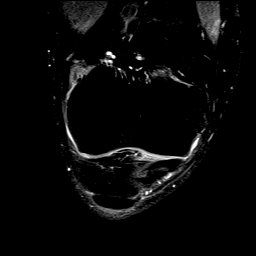

[40 of 40 positions shown; findings below may reference images not displayed]

FINDINGS: MENISCI

Medial: Intact.

Lateral: Intact.

LIGAMENTS

Cruciates: ACL and PCL are intact.

Collaterals: Medial collateral ligament is intact. Lateral
collateral ligament complex is intact.

CARTILAGE

Patellofemoral: Cartilage fissuring of the lateral trochlea with
subchondral reactive marrow edema. Cartilage fissuring of the
patellar apex and medial patellar facet.

Medial:  No chondral defect.

Lateral:  No chondral defect.

JOINT: Small joint effusion. Normal Deivi Lentini. No plical
thickening.

POPLITEAL FOSSA: Popliteus tendon is intact. No Baker's cyst.

EXTENSOR MECHANISM: Intact quadriceps tendon. Intact patellar
tendon. Intact lateral patellar retinaculum. Intact medial patellar
retinaculum. Intact MPFL.

BONES: No aggressive osseous lesion. No fracture or dislocation.

Other: No fluid collection or hematoma. Muscles are normal.
IMPRESSION: 1. Cartilage fissuring of the lateral trochlea with subchondral
reactive marrow edema. Cartilage fissuring of the patellar apex and
medial patellar facet.
2. No meniscal or ligamentous injury of right knee.

## 2021-07-12 DIAGNOSIS — L03011 Cellulitis of right finger: Secondary | ICD-10-CM | POA: Diagnosis not present

## 2021-07-26 DIAGNOSIS — A6001 Herpesviral infection of penis: Secondary | ICD-10-CM | POA: Diagnosis not present

## 2021-07-26 DIAGNOSIS — Z114 Encounter for screening for human immunodeficiency virus [HIV]: Secondary | ICD-10-CM | POA: Diagnosis not present

## 2021-07-26 DIAGNOSIS — Z7251 High risk heterosexual behavior: Secondary | ICD-10-CM | POA: Diagnosis not present

## 2021-08-20 DIAGNOSIS — Z125 Encounter for screening for malignant neoplasm of prostate: Secondary | ICD-10-CM | POA: Diagnosis not present

## 2021-08-20 DIAGNOSIS — Z3009 Encounter for other general counseling and advice on contraception: Secondary | ICD-10-CM | POA: Diagnosis not present

## 2021-09-01 DIAGNOSIS — N390 Urinary tract infection, site not specified: Secondary | ICD-10-CM | POA: Diagnosis not present

## 2021-11-21 DIAGNOSIS — Z302 Encounter for sterilization: Secondary | ICD-10-CM | POA: Diagnosis not present

## 2022-03-27 DIAGNOSIS — Z125 Encounter for screening for malignant neoplasm of prostate: Secondary | ICD-10-CM | POA: Diagnosis not present

## 2022-05-18 DIAGNOSIS — F4322 Adjustment disorder with anxiety: Secondary | ICD-10-CM | POA: Diagnosis not present

## 2022-06-03 DIAGNOSIS — F4322 Adjustment disorder with anxiety: Secondary | ICD-10-CM | POA: Diagnosis not present

## 2022-06-17 DIAGNOSIS — F4322 Adjustment disorder with anxiety: Secondary | ICD-10-CM | POA: Diagnosis not present

## 2022-07-02 DIAGNOSIS — F4322 Adjustment disorder with anxiety: Secondary | ICD-10-CM | POA: Diagnosis not present

## 2022-07-06 NOTE — Progress Notes (Unsigned)
   I, Peterson Lombard, LAT, ATC acting as a scribe for Lynne Leader, MD.  Travis Wiggins is a 41 y.o. male who presents to [address] at [facility] today for continued left knee pain.  Patient was last seen by Dr. Georgina Snell on 04/29/2021 and was given a Zilretta injection in his right knee and for his left, was advised to work on quad strengthening and use Voltaren gel.  Today, patient reports left knee pain worsening?  Patient locates pain to  L Knee swelling: Mechanical symptoms: Radiates: Aggravates: Treatments tried:  Pertinent review of systems: ***  Relevant historical information: ***   Exam:  There were no vitals taken for this visit. General: Well Developed, well nourished, and in no acute distress.   MSK: ***    Lab and Radiology Results No results found for this or any previous visit (from the past 72 hour(s)). No results found.     Assessment and Plan: 41 y.o. male with ***   PDMP not reviewed this encounter. No orders of the defined types were placed in this encounter.  No orders of the defined types were placed in this encounter.    Discussed warning signs or symptoms. Please see discharge instructions. Patient expresses understanding.   ***

## 2022-07-07 ENCOUNTER — Ambulatory Visit: Payer: BC Managed Care – PPO | Admitting: Family Medicine

## 2022-07-07 ENCOUNTER — Ambulatory Visit (INDEPENDENT_AMBULATORY_CARE_PROVIDER_SITE_OTHER): Payer: BC Managed Care – PPO

## 2022-07-07 ENCOUNTER — Ambulatory Visit: Payer: Self-pay

## 2022-07-07 VITALS — BP 128/80 | HR 77 | Ht 71.0 in | Wt 204.0 lb

## 2022-07-07 DIAGNOSIS — M25562 Pain in left knee: Secondary | ICD-10-CM

## 2022-07-07 NOTE — Patient Instructions (Addendum)
Thank you for coming in today.   Please get an Xray today before you leave   You received an injection today. Seek immediate medical attention if the joint becomes red, extremely painful, or is oozing fluid.   Let me know.

## 2022-07-08 DIAGNOSIS — R350 Frequency of micturition: Secondary | ICD-10-CM | POA: Diagnosis not present

## 2022-07-08 DIAGNOSIS — Z113 Encounter for screening for infections with a predominantly sexual mode of transmission: Secondary | ICD-10-CM | POA: Diagnosis not present

## 2022-07-09 DIAGNOSIS — F4322 Adjustment disorder with anxiety: Secondary | ICD-10-CM | POA: Diagnosis not present

## 2022-07-09 NOTE — Progress Notes (Signed)
Left knee x-ray looks pretty normal to radiology.

## 2022-07-16 DIAGNOSIS — F4322 Adjustment disorder with anxiety: Secondary | ICD-10-CM | POA: Diagnosis not present

## 2022-07-23 DIAGNOSIS — F4322 Adjustment disorder with anxiety: Secondary | ICD-10-CM | POA: Diagnosis not present

## 2022-07-29 DIAGNOSIS — Z125 Encounter for screening for malignant neoplasm of prostate: Secondary | ICD-10-CM | POA: Diagnosis not present

## 2022-07-29 DIAGNOSIS — R300 Dysuria: Secondary | ICD-10-CM | POA: Diagnosis not present

## 2022-07-29 DIAGNOSIS — R972 Elevated prostate specific antigen [PSA]: Secondary | ICD-10-CM | POA: Diagnosis not present

## 2022-07-30 DIAGNOSIS — F4322 Adjustment disorder with anxiety: Secondary | ICD-10-CM | POA: Diagnosis not present

## 2022-08-06 DIAGNOSIS — F4322 Adjustment disorder with anxiety: Secondary | ICD-10-CM | POA: Diagnosis not present

## 2022-08-10 DIAGNOSIS — R300 Dysuria: Secondary | ICD-10-CM | POA: Diagnosis not present

## 2022-08-10 DIAGNOSIS — R972 Elevated prostate specific antigen [PSA]: Secondary | ICD-10-CM | POA: Diagnosis not present

## 2022-08-28 DIAGNOSIS — F4322 Adjustment disorder with anxiety: Secondary | ICD-10-CM | POA: Diagnosis not present

## 2022-09-07 DIAGNOSIS — F4322 Adjustment disorder with anxiety: Secondary | ICD-10-CM | POA: Diagnosis not present

## 2022-09-17 DIAGNOSIS — F4322 Adjustment disorder with anxiety: Secondary | ICD-10-CM | POA: Diagnosis not present

## 2022-09-24 DIAGNOSIS — F4322 Adjustment disorder with anxiety: Secondary | ICD-10-CM | POA: Diagnosis not present

## 2022-09-28 DIAGNOSIS — F4322 Adjustment disorder with anxiety: Secondary | ICD-10-CM | POA: Diagnosis not present

## 2022-10-19 DIAGNOSIS — F4322 Adjustment disorder with anxiety: Secondary | ICD-10-CM | POA: Diagnosis not present

## 2022-10-29 DIAGNOSIS — F4322 Adjustment disorder with anxiety: Secondary | ICD-10-CM | POA: Diagnosis not present

## 2022-11-04 ENCOUNTER — Ambulatory Visit: Payer: Self-pay

## 2022-11-04 ENCOUNTER — Ambulatory Visit: Payer: BC Managed Care – PPO | Admitting: Family Medicine

## 2022-11-04 VITALS — BP 158/86 | HR 70 | Ht 71.0 in | Wt 202.0 lb

## 2022-11-04 DIAGNOSIS — M25522 Pain in left elbow: Secondary | ICD-10-CM

## 2022-11-04 NOTE — Progress Notes (Signed)
   I, Peterson Lombard, LAT, ATC acting as a scribe for Lynne Leader, MD.  Travis Wiggins is a 42 y.o. male who presents to [place] at [facility] today for left elbow pain. Pt is R-hand dominate Patient was previously seen by Dr. Georgina Snell on 07/07/2022 for his left knee.  Today, patient reports left elbow pain ongoing since last night into this morning.  Pt locates pain to the lateral aspect of the L elbow.   L elbow swelling: yes Grip strength: painful Radiates: no Paresthesias: no Aggravates: full elbow extension and flexion, computer work Treatments tried: ice, compression sleeve, IBU  Pertinent review of systems: No fevers or chills  Relevant historical information: History knee pain in the past due to chondromalacia patella. His son was recently in the emergency room for a skull fracture after falling off the back of a power wheel.  Fortunately he is doing well with no intervention needed aside from watchful waiting.   Exam:  BP (!) 158/86   Pulse 70   Ht 5\' 11"  (1.803 m)   Wt 202 lb (91.6 kg)   SpO2 98%   BMI 28.17 kg/m  General: Well Developed, well nourished, and in no acute distress.   MSK: Left elbow: Swollen at lateral epicondyle.  Tender palpation lateral condyle.  Normal range of motion pain with flexion. Strength is intact. Pain with resisted wrist and finger extension is present.    Lab and Radiology Results  Diagnostic Limited MSK Ultrasound of: Left elbow Extensor tendon origin at lateral epicondyle is intact without visible tear.  However there is increased Doppler activity consistent with neovascularity/inflammation that could represent a tear not well-seen on more conventional ultrasound in the common extensor tendon origin. No significant joint effusion Impression: Lateral epicondylitis versus possible tear      Assessment and Plan: 42 y.o. male with left lateral elbow pain thought to be due to lateral epicondylitis.  Plan for  compression and relative rest and home exercise program.  Maximize NSAIDs and Tylenol.  Discussed max safe dosing of both of these medications. We also talked about prescription medications.  Consider prednisone and opiates in the future if needed.  He will let me know. Talked about compressive devices.  He already has a body helix compressive sleeve.  I showed him how to modify it to maximize compression overlying the lateral epicondyle.  Recheck in 1 month.  Check back earlier or contact me earlier if needed.  Occupational Therapy.  OT would typically be a good choice but is can to be challenging for him right now as he is caring for his child. Social determinants of health.  PDMP not reviewed this encounter. Orders Placed This Encounter  Procedures   [place] LIMITED JOINT SPACE STRUCTURES UP LEFT(NO LINKED CHARGES)    Order Specific Question:   Reason for Exam (SYMPTOM  OR DIAGNOSIS REQUIRED)    Answer:   left elbow pain    Order Specific Question:   Preferred imaging location?    Answer:   [place]   No orders of the defined types were placed in this encounter.    Discussed warning signs or symptoms. Please see discharge instructions. Patient expresses understanding.   The above documentation has been reviewed and is accurate and complete Lynne Leader, M.D.

## 2022-11-04 NOTE — Patient Instructions (Addendum)
Thank you for coming in today.   Continue wearing the compression sleeve  Maximize aleve and tylenol   Work on stretch and strength when feeling better.   Recheck in 1 month.  Let me know if this is not working.   Let me or your peds know if you son needs anything with that head injury.

## 2022-11-12 DIAGNOSIS — F4322 Adjustment disorder with anxiety: Secondary | ICD-10-CM | POA: Diagnosis not present

## 2022-11-20 DIAGNOSIS — F4322 Adjustment disorder with anxiety: Secondary | ICD-10-CM | POA: Diagnosis not present

## 2022-11-24 DIAGNOSIS — F4322 Adjustment disorder with anxiety: Secondary | ICD-10-CM | POA: Diagnosis not present

## 2022-12-04 DIAGNOSIS — F4322 Adjustment disorder with anxiety: Secondary | ICD-10-CM | POA: Diagnosis not present

## 2022-12-22 DIAGNOSIS — F4322 Adjustment disorder with anxiety: Secondary | ICD-10-CM | POA: Diagnosis not present

## 2023-01-04 DIAGNOSIS — F4322 Adjustment disorder with anxiety: Secondary | ICD-10-CM | POA: Diagnosis not present

## 2023-01-12 DIAGNOSIS — F4322 Adjustment disorder with anxiety: Secondary | ICD-10-CM | POA: Diagnosis not present

## 2023-01-22 DIAGNOSIS — F4322 Adjustment disorder with anxiety: Secondary | ICD-10-CM | POA: Diagnosis not present

## 2023-01-28 DIAGNOSIS — F4322 Adjustment disorder with anxiety: Secondary | ICD-10-CM | POA: Diagnosis not present

## 2023-02-03 ENCOUNTER — Other Ambulatory Visit: Payer: Self-pay

## 2023-02-03 ENCOUNTER — Ambulatory Visit: Payer: BC Managed Care – PPO | Admitting: Family Medicine

## 2023-02-03 ENCOUNTER — Encounter: Payer: Self-pay | Admitting: Family Medicine

## 2023-02-03 VITALS — BP 120/82 | HR 107 | Ht 71.0 in | Wt 195.8 lb

## 2023-02-03 DIAGNOSIS — M25562 Pain in left knee: Secondary | ICD-10-CM

## 2023-02-03 DIAGNOSIS — G8929 Other chronic pain: Secondary | ICD-10-CM

## 2023-02-03 DIAGNOSIS — F4322 Adjustment disorder with anxiety: Secondary | ICD-10-CM | POA: Diagnosis not present

## 2023-02-03 NOTE — Patient Instructions (Addendum)
Thank you for coming in today.   You should hear from MRI scheduling within 1 week. If you do not hear please let me know.   Check back after MRI  

## 2023-02-03 NOTE — Progress Notes (Signed)
   [name], PhD, LAT, ATC acting as a scribe for [name], MD.  Travis Wiggins is a 42 y.o. male who presents to [facility] at [facility] today for L knee pain. Pt was last seen by Dr. Denyse Amass for his L knee on 07/07/22 and was given a L knee steroid injection.  Today, pt c/o L knee pain x 3-4 weeks. Pt locates pain to peripatellar region, and distal to patella. Mechanical sx present with ascending stairs, feels unsteady. Intermittent mild swelling. Denies radicular sx. Unsure what triggered flare up. Ice prn for knee sx. Pt notes what last injection worked well for the left knee sx. He notes pain and clicking  Dx imaging: 07/07/22 L knee XR  Pertinent review of systems: No fevers or chills  Relevant historical information: Similar right knee pain   Exam:  BP 120/82   Pulse (!) 107   Ht 5\' 11"  (1.803 m)   Wt 195 lb 12.8 oz (88.8 kg)   SpO2 98%   BMI 27.31 kg/m  General: Well Developed, well nourished, and in no acute distress.   MSK: Left knee: Normal-appearing Nontender. Normal motion with crepitation. Positive McMurray's test. Intact strength. Stable ligamentous exam.    Lab and Radiology Results  EXAM: LEFT KNEE 3 VIEWS   COMPARISON:  No recent prior.   FINDINGS: Tiny knee joint effusion cannot be excluded. Tiny well-circumscribed sclerotic density noted the proximal tibia, most likely a benign bone island. No evidence of fracture or dislocation.   IMPRESSION: 1.  No acute bony abnormality identified.   2.  Small knee joint effusion cannot be excluded.     Electronically Signed   By: Maisie Fus  Register M.D.   On: 07/08/2022 08:37 I, [name], personally (independently) visualized and performed the interpretation of the images attached in this note.    Assessment and Plan: 42 y.o. male with left knee pain.  This is an acute exacerbation of a chronic problem.  He was seen for the same issue October 2023.  At that time he did have  a steroid injection which provided some relief.  He notes continued pain clicking clunking mechanical symptoms.  He is done a home exercise program for at least 6 weeks directed by me after the last visit including quad strengthening exercises.  At this point concern for degenerative meniscus tear or OCD lesion.  Plan for MRI.  Recheck following MRI unless there is a clear surgical problem.   PDMP not reviewed this encounter. Orders Placed This Encounter  Procedures   MR Knee Left  Wo Contrast    Standing Status:   Future    Standing Expiration Date:   02/03/2024    Order Specific Question:   What is the patient's sedation requirement?    Answer:   No Sedation    Order Specific Question:   Does the patient have a pacemaker or implanted devices?    Answer:   No    Order Specific Question:   Preferred imaging location?    Answer:   [facility] (table limit-350lbs)   No orders of the defined types were placed in this encounter.    Discussed warning signs or symptoms. Please see discharge instructions. Patient expresses understanding.   The above documentation has been reviewed and is accurate and complete [name], M.D.

## 2023-02-18 DIAGNOSIS — F4322 Adjustment disorder with anxiety: Secondary | ICD-10-CM | POA: Diagnosis not present

## 2023-02-22 ENCOUNTER — Other Ambulatory Visit: Payer: BC Managed Care – PPO

## 2023-02-23 ENCOUNTER — Other Ambulatory Visit: Payer: BC Managed Care – PPO

## 2023-03-02 ENCOUNTER — Ambulatory Visit (INDEPENDENT_AMBULATORY_CARE_PROVIDER_SITE_OTHER): Payer: BC Managed Care – PPO

## 2023-03-02 DIAGNOSIS — G8929 Other chronic pain: Secondary | ICD-10-CM | POA: Diagnosis not present

## 2023-03-02 DIAGNOSIS — M25562 Pain in left knee: Secondary | ICD-10-CM

## 2023-03-04 DIAGNOSIS — F4322 Adjustment disorder with anxiety: Secondary | ICD-10-CM | POA: Diagnosis not present

## 2023-03-08 ENCOUNTER — Other Ambulatory Visit: Payer: Self-pay

## 2023-03-08 ENCOUNTER — Ambulatory Visit: Payer: BC Managed Care – PPO | Admitting: Family Medicine

## 2023-03-08 VITALS — BP 112/68 | HR 64 | Ht 71.0 in

## 2023-03-08 DIAGNOSIS — M25562 Pain in left knee: Secondary | ICD-10-CM

## 2023-03-08 DIAGNOSIS — M2242 Chondromalacia patellae, left knee: Secondary | ICD-10-CM

## 2023-03-08 DIAGNOSIS — G8929 Other chronic pain: Secondary | ICD-10-CM

## 2023-03-08 NOTE — Patient Instructions (Signed)
Thank you for coming in today.   Call or go to the ER if you develop a large red swollen joint with extreme pain or oozing puss.    Let me know how this goes.

## 2023-03-08 NOTE — Progress Notes (Signed)
Travis Payor, PhD, LAT, ATC acting as a scribe for Travis Graham, MD.  Travis Wiggins is a 42 y.o. male who presents to [facility] at [facility] today for f/ L knee pain w/ MRI review. Pt was last seen by Dr. Denyse Amass on 02/03/23 and a MRI was ordered. Today, pt reports that his knee is still swollen enough to be uncomfortable to bend on that knee. Looking for the next step with the results of MRI.   Dx imaging: 03/02/23 L knee MRI 07/07/22 L knee XR   Pertinent review of systems: No fevers or chills  Relevant historical information: History of similar right knee chondromalacia patella.   Exam:  BP 112/68   Pulse 64   Ht 5\' 11"  (1.803 m)   SpO2 99%   BMI 27.31 kg/m  General: Well Developed, well nourished, and in no acute distress.   MSK: Left knee moderate effusion otherwise normal. Normal motion with crepitation.    Lab and Radiology Results  Procedure: Real-time Ultrasound Guided Injection of left knee joint superior lateral patellar space Device: Philips Affiniti 50G Images permanently stored and available for review in PACS Verbal informed consent obtained.  Discussed risks and benefits of procedure. Warned about infection, bleeding, hyperglycemia damage to structures among others. Patient expresses understanding and agreement Time-out conducted.   Noted no overlying erythema, induration, or other signs of local infection.   Skin prepped in a sterile fashion.   Local anesthesia: Topical Ethyl chloride.   With sterile technique and under real time ultrasound guidance: 40 mg of Kenalog and 2 mL of Marcaine injected into knee joint Karst. Fluid seen entering the joint capsule.   Completed without difficulty   Pain immediately resolved suggesting accurate placement of the medication.   Advised to call if fevers/chills, erythema, induration, drainage, or persistent bleeding.   Images permanently stored and available for review in the ultrasound unit.   Impression: Technically successful ultrasound guided injection.   EXAM: MRI OF THE LEFT KNEE WITHOUT CONTRAST   TECHNIQUE: Multiplanar, multisequence MR imaging of the knee was performed. No intravenous contrast was administered.   COMPARISON:  Left knee x-rays dated July 07, 2022.   FINDINGS: MENISCI   Medial meniscus: Intact. Mild linear degenerative signal in the posterior body and horn.   Lateral meniscus:  Intact.  Minimal degenerative signal in the body.   LIGAMENTS   Cruciates:  Intact ACL and PCL.   Collaterals: Medial collateral ligament is intact. Lateral collateral ligament complex is intact.   CARTILAGE   Patellofemoral: Focal full-thickness cartilage fissuring and delamination over the trochlear groove with underlying subchondral marrow edema.   Medial:  No chondral defect.   Lateral:  No chondral defect.   Joint:  Moderate joint effusion.  Normal Hoffa's fat.   Popliteal Fossa:  No Baker cyst. Intact popliteus tendon.   Extensor Mechanism: Intact quadriceps tendon and patellar tendon. Intact medial and lateral patellar retinaculum. Intact MPFL.   Bones: No acute fracture or dislocation. Slight lateral patellar subluxation. No suspicious bone lesion.   Other: None.   IMPRESSION: 1. No meniscal or ligamentous injury. 2. Mild patellofemoral compartment osteoarthritis. 3. Moderate joint effusion.     Electronically Signed   By: Obie Dredge M.D.   On: 03/08/2023 10:25 I, Travis Wiggins, personally (independently) visualized and performed the interpretation of the images attached in this note.      Assessment and Plan: 42 y.o. male with left knee pain due to patellofemoral chondromalacia.  Plan for steroid injection.  Continue quad strengthening and Voltaren gel.  Check back as needed.  Can repeat the same injection every 3 months if needed.   PDMP not reviewed this encounter. Orders Placed This Encounter  Procedures   [place] LIMITED  JOINT SPACE STRUCTURES LOW LEFT(NO LINKED CHARGES)    Standing Status:   Future    Number of Occurrences:   1    Standing Expiration Date:   03/07/2024    Order Specific Question:   Reason for Exam (SYMPTOM  OR DIAGNOSIS REQUIRED)    Answer:   Left knee pain    Order Specific Question:   Preferred imaging location?    Answer:   [facility]   No orders of the defined types were placed in this encounter.    Discussed warning signs or symptoms. Please see discharge instructions. Patient expresses understanding.   The above documentation has been reviewed and is accurate and complete Travis Wiggins, M.D.

## 2023-03-09 NOTE — Progress Notes (Signed)
As we spoke yesterday you do have some arthritis especially underneath the kneecap.

## 2023-03-15 DIAGNOSIS — F4322 Adjustment disorder with anxiety: Secondary | ICD-10-CM | POA: Diagnosis not present

## 2023-03-22 DIAGNOSIS — F4322 Adjustment disorder with anxiety: Secondary | ICD-10-CM | POA: Diagnosis not present

## 2023-03-23 DIAGNOSIS — F411 Generalized anxiety disorder: Secondary | ICD-10-CM | POA: Diagnosis not present

## 2023-03-29 DIAGNOSIS — F4322 Adjustment disorder with anxiety: Secondary | ICD-10-CM | POA: Diagnosis not present

## 2023-04-07 DIAGNOSIS — F411 Generalized anxiety disorder: Secondary | ICD-10-CM | POA: Diagnosis not present

## 2023-04-12 DIAGNOSIS — F4322 Adjustment disorder with anxiety: Secondary | ICD-10-CM | POA: Diagnosis not present

## 2023-04-23 DIAGNOSIS — F4322 Adjustment disorder with anxiety: Secondary | ICD-10-CM | POA: Diagnosis not present

## 2023-04-23 DIAGNOSIS — F411 Generalized anxiety disorder: Secondary | ICD-10-CM | POA: Diagnosis not present

## 2023-05-04 DIAGNOSIS — F411 Generalized anxiety disorder: Secondary | ICD-10-CM | POA: Diagnosis not present

## 2023-05-07 DIAGNOSIS — F4322 Adjustment disorder with anxiety: Secondary | ICD-10-CM | POA: Diagnosis not present

## 2023-05-18 DIAGNOSIS — F411 Generalized anxiety disorder: Secondary | ICD-10-CM | POA: Diagnosis not present

## 2023-05-21 DIAGNOSIS — F4322 Adjustment disorder with anxiety: Secondary | ICD-10-CM | POA: Diagnosis not present

## 2023-06-03 DIAGNOSIS — F411 Generalized anxiety disorder: Secondary | ICD-10-CM | POA: Diagnosis not present

## 2023-06-14 DIAGNOSIS — F4322 Adjustment disorder with anxiety: Secondary | ICD-10-CM | POA: Diagnosis not present

## 2023-06-16 DIAGNOSIS — F411 Generalized anxiety disorder: Secondary | ICD-10-CM | POA: Diagnosis not present

## 2023-06-28 DIAGNOSIS — F4322 Adjustment disorder with anxiety: Secondary | ICD-10-CM | POA: Diagnosis not present

## 2023-07-01 DIAGNOSIS — F411 Generalized anxiety disorder: Secondary | ICD-10-CM | POA: Diagnosis not present

## 2023-07-12 DIAGNOSIS — F4322 Adjustment disorder with anxiety: Secondary | ICD-10-CM | POA: Diagnosis not present

## 2023-07-23 DIAGNOSIS — F4322 Adjustment disorder with anxiety: Secondary | ICD-10-CM | POA: Diagnosis not present

## 2023-07-29 DIAGNOSIS — F411 Generalized anxiety disorder: Secondary | ICD-10-CM | POA: Diagnosis not present

## 2023-08-02 DIAGNOSIS — K610 Anal abscess: Secondary | ICD-10-CM | POA: Diagnosis not present

## 2023-08-03 DIAGNOSIS — K611 Rectal abscess: Secondary | ICD-10-CM | POA: Diagnosis not present

## 2023-08-04 DIAGNOSIS — R972 Elevated prostate specific antigen [PSA]: Secondary | ICD-10-CM | POA: Diagnosis not present

## 2023-08-10 DIAGNOSIS — K611 Rectal abscess: Secondary | ICD-10-CM | POA: Diagnosis not present

## 2023-08-11 DIAGNOSIS — F4322 Adjustment disorder with anxiety: Secondary | ICD-10-CM | POA: Diagnosis not present

## 2023-08-11 DIAGNOSIS — R972 Elevated prostate specific antigen [PSA]: Secondary | ICD-10-CM | POA: Diagnosis not present

## 2023-09-09 DIAGNOSIS — F4322 Adjustment disorder with anxiety: Secondary | ICD-10-CM | POA: Diagnosis not present

## 2023-09-28 DIAGNOSIS — F411 Generalized anxiety disorder: Secondary | ICD-10-CM | POA: Diagnosis not present

## 2023-09-30 DIAGNOSIS — F4322 Adjustment disorder with anxiety: Secondary | ICD-10-CM | POA: Diagnosis not present

## 2023-10-12 DIAGNOSIS — K6289 Other specified diseases of anus and rectum: Secondary | ICD-10-CM | POA: Diagnosis not present

## 2023-11-04 DIAGNOSIS — F4322 Adjustment disorder with anxiety: Secondary | ICD-10-CM | POA: Diagnosis not present

## 2023-11-15 DIAGNOSIS — F4322 Adjustment disorder with anxiety: Secondary | ICD-10-CM | POA: Diagnosis not present

## 2023-11-29 DIAGNOSIS — F4322 Adjustment disorder with anxiety: Secondary | ICD-10-CM | POA: Diagnosis not present

## 2023-12-09 DIAGNOSIS — Z6836 Body mass index (BMI) 36.0-36.9, adult: Secondary | ICD-10-CM | POA: Diagnosis not present

## 2023-12-09 DIAGNOSIS — Z7251 High risk heterosexual behavior: Secondary | ICD-10-CM | POA: Diagnosis not present

## 2023-12-09 DIAGNOSIS — Z202 Contact with and (suspected) exposure to infections with a predominantly sexual mode of transmission: Secondary | ICD-10-CM | POA: Diagnosis not present

## 2023-12-09 DIAGNOSIS — Z708 Other sex counseling: Secondary | ICD-10-CM | POA: Diagnosis not present

## 2023-12-13 DIAGNOSIS — F4322 Adjustment disorder with anxiety: Secondary | ICD-10-CM | POA: Diagnosis not present

## 2023-12-23 NOTE — Progress Notes (Unsigned)
   [name], PhD, LAT, ATC acting as a scribe for Travis Graham, MD.  Travis Wiggins is a 43 y.o. male who presents to [facility] at [facility] today for R elbow pain. Pt was previously seen by Dr. Denyse Wiggins on 03/08/23 for L knee pain.  Today, pt c/o R elbow pain x ***. Pt locates pain to ***  Radiates: Paresthesia: Grip strength: Aggravates: Treatments tried:  Pertinent review of systems: ***  Relevant historical information: ***   Exam:  There were no vitals taken for this visit. General: Well Developed, well nourished, and in no acute distress.   MSK: ***    Lab and Radiology Results No results found for this or any previous visit (from the past 72 hours). No results found.     Assessment and Plan: 43 y.o. male with ***   PDMP not reviewed this encounter. No orders of the defined types were placed in this encounter.  No orders of the defined types were placed in this encounter.    Discussed warning signs or symptoms. Please see discharge instructions. Patient expresses understanding.   ***

## 2023-12-24 ENCOUNTER — Encounter: Payer: Self-pay | Admitting: Family Medicine

## 2023-12-24 ENCOUNTER — Other Ambulatory Visit: Payer: Self-pay

## 2023-12-24 ENCOUNTER — Ambulatory Visit (INDEPENDENT_AMBULATORY_CARE_PROVIDER_SITE_OTHER)

## 2023-12-24 ENCOUNTER — Ambulatory Visit: Admitting: Family Medicine

## 2023-12-24 VITALS — BP 120/84 | HR 77 | Ht 71.0 in | Wt 198.0 lb

## 2023-12-24 DIAGNOSIS — M25521 Pain in right elbow: Secondary | ICD-10-CM

## 2023-12-24 MED ORDER — MELOXICAM 15 MG PO TABS: ORAL_TABLET | ORAL | 3 refills | Status: AC

## 2023-12-24 NOTE — Patient Instructions (Addendum)
 Thank you for coming in today.   Please get an Xray today before you leave   Start Meloxicam as directed.   Avoid full range of motion witt lifting.   OK to lift if things feel OK.

## 2023-12-27 ENCOUNTER — Encounter: Payer: Self-pay | Admitting: Family Medicine

## 2023-12-27 NOTE — Progress Notes (Signed)
 Right elbow x-ray shows a little bit of swelling in the joint.  There is a little bit of spur formation as well.

## 2024-02-04 DIAGNOSIS — F4322 Adjustment disorder with anxiety: Secondary | ICD-10-CM | POA: Diagnosis not present

## 2024-02-18 DIAGNOSIS — F4322 Adjustment disorder with anxiety: Secondary | ICD-10-CM | POA: Diagnosis not present

## 2024-03-27 DIAGNOSIS — F4322 Adjustment disorder with anxiety: Secondary | ICD-10-CM | POA: Diagnosis not present

## 2024-04-10 DIAGNOSIS — F4322 Adjustment disorder with anxiety: Secondary | ICD-10-CM | POA: Diagnosis not present

## 2024-04-17 NOTE — Progress Notes (Unsigned)
   [name], PhD, LAT, ATC acting as a scribe for Travis Lloyd, MD.  Travis Wiggins is a 43 y.o. male who presents to [facility] at [facility] today for cont'd L knee pain. Pt was last seen by Dr. Lloyd on 03/08/23 and was given a L knee steroid injection  Today, pt reports ***  Dx imaging: 03/02/23 L knee MRI 07/07/22 L knee XR  Pertinent review of systems: ***  Relevant historical information: ***   Exam:  There were no vitals taken for this visit. General: Well Developed, well nourished, and in no acute distress.   MSK: ***    Lab and Radiology Results No results found for this or any previous visit (from the past 72 hours). No results found.     Assessment and Plan: 43 y.o. male with ***   PDMP not reviewed this encounter. No orders of the defined types were placed in this encounter.  No orders of the defined types were placed in this encounter.    Discussed warning signs or symptoms. Please see discharge instructions. Patient expresses understanding.   ***

## 2024-04-18 ENCOUNTER — Encounter: Payer: Self-pay | Admitting: Family Medicine

## 2024-04-18 ENCOUNTER — Ambulatory Visit: Admitting: Family Medicine

## 2024-04-18 ENCOUNTER — Other Ambulatory Visit: Payer: Self-pay

## 2024-04-18 VITALS — BP 112/76 | HR 69 | Ht 71.0 in

## 2024-04-18 DIAGNOSIS — M2242 Chondromalacia patellae, left knee: Secondary | ICD-10-CM | POA: Diagnosis not present

## 2024-04-18 DIAGNOSIS — G8929 Other chronic pain: Secondary | ICD-10-CM

## 2024-04-18 DIAGNOSIS — M25562 Pain in left knee: Secondary | ICD-10-CM | POA: Diagnosis not present

## 2024-04-18 NOTE — Patient Instructions (Addendum)
 Thank you for coming in today.   You received an injection today. Seek immediate medical attention if the joint becomes red, extremely painful, or is oozing fluid.   See you back as needed.

## 2024-04-24 DIAGNOSIS — Z6827 Body mass index (BMI) 27.0-27.9, adult: Secondary | ICD-10-CM | POA: Diagnosis not present

## 2024-04-24 DIAGNOSIS — J029 Acute pharyngitis, unspecified: Secondary | ICD-10-CM | POA: Diagnosis not present

## 2024-04-24 DIAGNOSIS — F4322 Adjustment disorder with anxiety: Secondary | ICD-10-CM | POA: Diagnosis not present

## 2024-05-08 DIAGNOSIS — F4322 Adjustment disorder with anxiety: Secondary | ICD-10-CM | POA: Diagnosis not present

## 2024-05-23 DIAGNOSIS — F4322 Adjustment disorder with anxiety: Secondary | ICD-10-CM | POA: Diagnosis not present

## 2024-06-06 DIAGNOSIS — F4322 Adjustment disorder with anxiety: Secondary | ICD-10-CM | POA: Diagnosis not present

## 2024-06-06 DIAGNOSIS — K603 Anal fistula, unspecified: Secondary | ICD-10-CM | POA: Diagnosis not present

## 2024-06-23 DIAGNOSIS — K603 Anal fistula, unspecified: Secondary | ICD-10-CM | POA: Diagnosis not present

## 2024-06-23 DIAGNOSIS — K60312 Anal fistula, simple, persistent: Secondary | ICD-10-CM | POA: Diagnosis not present

## 2024-06-26 DIAGNOSIS — Z7251 High risk heterosexual behavior: Secondary | ICD-10-CM | POA: Diagnosis not present

## 2024-06-26 DIAGNOSIS — Z113 Encounter for screening for infections with a predominantly sexual mode of transmission: Secondary | ICD-10-CM | POA: Diagnosis not present

## 2024-06-26 DIAGNOSIS — Z1159 Encounter for screening for other viral diseases: Secondary | ICD-10-CM | POA: Diagnosis not present

## 2024-06-26 DIAGNOSIS — Z202 Contact with and (suspected) exposure to infections with a predominantly sexual mode of transmission: Secondary | ICD-10-CM | POA: Diagnosis not present

## 2024-06-26 DIAGNOSIS — Z114 Encounter for screening for human immunodeficiency virus [HIV]: Secondary | ICD-10-CM | POA: Diagnosis not present

## 2024-06-29 DIAGNOSIS — K603 Anal fistula, unspecified: Secondary | ICD-10-CM | POA: Diagnosis not present

## 2024-06-30 DIAGNOSIS — F4322 Adjustment disorder with anxiety: Secondary | ICD-10-CM | POA: Diagnosis not present

## 2024-07-04 DIAGNOSIS — Z0000 Encounter for general adult medical examination without abnormal findings: Secondary | ICD-10-CM | POA: Diagnosis not present

## 2024-07-04 DIAGNOSIS — Z6827 Body mass index (BMI) 27.0-27.9, adult: Secondary | ICD-10-CM | POA: Diagnosis not present

## 2024-07-06 DIAGNOSIS — Z0000 Encounter for general adult medical examination without abnormal findings: Secondary | ICD-10-CM | POA: Diagnosis not present

## 2024-07-06 DIAGNOSIS — Z6827 Body mass index (BMI) 27.0-27.9, adult: Secondary | ICD-10-CM | POA: Diagnosis not present

## 2024-07-19 DIAGNOSIS — F4322 Adjustment disorder with anxiety: Secondary | ICD-10-CM | POA: Diagnosis not present

## 2024-08-03 DIAGNOSIS — K603 Anal fistula, unspecified: Secondary | ICD-10-CM | POA: Diagnosis not present

## 2024-08-11 DIAGNOSIS — F4322 Adjustment disorder with anxiety: Secondary | ICD-10-CM | POA: Diagnosis not present

## 2024-09-05 DIAGNOSIS — F4322 Adjustment disorder with anxiety: Secondary | ICD-10-CM | POA: Diagnosis not present
